# Patient Record
Sex: Female | Born: 1950 | Race: Black or African American | Hispanic: No | Marital: Married | Smoking: Never smoker
Health system: Southern US, Community
[De-identification: ages and names within clinical notes are randomized; demographics above are authoritative.]

## PROBLEM LIST (undated history)

## (undated) DIAGNOSIS — D869 Sarcoidosis, unspecified: Secondary | ICD-10-CM

## (undated) DIAGNOSIS — E119 Type 2 diabetes mellitus without complications: Secondary | ICD-10-CM

## (undated) DIAGNOSIS — G20A1 Parkinson's disease without dyskinesia, without mention of fluctuations: Secondary | ICD-10-CM

## (undated) DIAGNOSIS — J45909 Unspecified asthma, uncomplicated: Secondary | ICD-10-CM

## (undated) DIAGNOSIS — I10 Essential (primary) hypertension: Secondary | ICD-10-CM

## (undated) DIAGNOSIS — R569 Unspecified convulsions: Secondary | ICD-10-CM

## (undated) DIAGNOSIS — G20 Parkinson's disease: Secondary | ICD-10-CM

## (undated) HISTORY — PX: NASAL SINUS SURGERY: SHX719

## (undated) HISTORY — PX: TUBAL LIGATION: SHX77

## (undated) HISTORY — PX: ABDOMINAL HYSTERECTOMY: SHX81

---

## 2009-09-22 ENCOUNTER — Emergency Department (HOSPITAL_BASED_OUTPATIENT_CLINIC_OR_DEPARTMENT_OTHER): Admission: EM | Admit: 2009-09-22 | Discharge: 2009-09-23 | Payer: Self-pay | Admitting: Emergency Medicine

## 2009-09-22 ENCOUNTER — Ambulatory Visit: Payer: Self-pay | Admitting: Diagnostic Radiology

## 2010-03-20 HISTORY — PX: BREAST BIOPSY: SHX20

## 2010-09-04 LAB — URINALYSIS, ROUTINE W REFLEX MICROSCOPIC
Bilirubin Urine: NEGATIVE
Ketones, ur: NEGATIVE mg/dL
Specific Gravity, Urine: 1.021 (ref 1.005–1.030)
Urobilinogen, UA: 0.2 mg/dL (ref 0.0–1.0)

## 2010-09-04 LAB — COMPREHENSIVE METABOLIC PANEL
Albumin: 4.1 g/dL (ref 3.5–5.2)
Calcium: 9.5 mg/dL (ref 8.4–10.5)
Chloride: 101 mEq/L (ref 96–112)
GFR calc Af Amer: 43 mL/min — ABNORMAL LOW (ref >60)
GFR calc non Af Amer: 35 mL/min — ABNORMAL LOW (ref >60)
Glucose, Bld: 110 mg/dL — ABNORMAL HIGH (ref 70–99)
Potassium: 4.7 mEq/L (ref 3.5–5.1)
Sodium: 146 mEq/L — ABNORMAL HIGH (ref 135–145)
Total Protein: 8.3 g/dL (ref 6.0–8.3)

## 2010-09-04 LAB — DIFFERENTIAL
Basophils Absolute: 0 10*3/uL (ref 0.0–0.1)
Basophils Relative: 0 % (ref 0–1)
Eosinophils Absolute: 0.7 10*3/uL (ref 0.0–0.7)
Eosinophils Relative: 10 % — ABNORMAL HIGH (ref 0–5)
Monocytes Absolute: 0.8 10*3/uL (ref 0.1–1.0)
Monocytes Relative: 11 % (ref 3–12)

## 2010-09-04 LAB — CBC
HCT: 41.3 % (ref 36.0–46.0)
Hemoglobin: 13 g/dL (ref 12.0–15.0)
MCHC: 31.5 g/dL (ref 30.0–36.0)
MCV: 89.4 fL (ref 78.0–100.0)
RBC: 4.62 MIL/uL (ref 3.87–5.11)
RDW: 14.7 % (ref 11.5–15.5)
WBC: 7.1 10*3/uL (ref 4.0–10.5)

## 2010-09-04 LAB — URINE MICROSCOPIC-ADD ON

## 2010-09-19 ENCOUNTER — Emergency Department (INDEPENDENT_AMBULATORY_CARE_PROVIDER_SITE_OTHER): Payer: BC Managed Care – PPO

## 2010-09-19 ENCOUNTER — Emergency Department (HOSPITAL_BASED_OUTPATIENT_CLINIC_OR_DEPARTMENT_OTHER)
Admission: EM | Admit: 2010-09-19 | Discharge: 2010-09-19 | Disposition: A | Discharge status: Home / Self Care | Payer: BC Managed Care – PPO | Attending: Emergency Medicine | Admitting: Emergency Medicine

## 2010-09-19 DIAGNOSIS — R0602 Shortness of breath: Secondary | ICD-10-CM | POA: Insufficient documentation

## 2010-09-19 DIAGNOSIS — E119 Type 2 diabetes mellitus without complications: Secondary | ICD-10-CM | POA: Insufficient documentation

## 2010-09-19 DIAGNOSIS — R0609 Other forms of dyspnea: Secondary | ICD-10-CM | POA: Insufficient documentation

## 2010-09-19 DIAGNOSIS — R0989 Other specified symptoms and signs involving the circulatory and respiratory systems: Secondary | ICD-10-CM | POA: Insufficient documentation

## 2010-09-19 DIAGNOSIS — E785 Hyperlipidemia, unspecified: Secondary | ICD-10-CM | POA: Insufficient documentation

## 2010-09-19 DIAGNOSIS — I10 Essential (primary) hypertension: Secondary | ICD-10-CM | POA: Insufficient documentation

## 2010-09-19 DIAGNOSIS — D869 Sarcoidosis, unspecified: Secondary | ICD-10-CM | POA: Insufficient documentation

## 2010-09-19 DIAGNOSIS — Z79899 Other long term (current) drug therapy: Secondary | ICD-10-CM | POA: Insufficient documentation

## 2010-09-19 DIAGNOSIS — R079 Chest pain, unspecified: Secondary | ICD-10-CM

## 2010-09-19 LAB — BASIC METABOLIC PANEL
BUN: 20 mg/dL (ref 6–23)
CO2: 32 mEq/L (ref 19–32)
Chloride: 101 mEq/L (ref 96–112)
Creatinine, Ser: 1 mg/dL (ref 0.4–1.2)
GFR calc Af Amer: >60 mL/min (ref >60)
GFR calc non Af Amer: 56 mL/min — ABNORMAL LOW (ref >60)
Glucose, Bld: 138 mg/dL — ABNORMAL HIGH (ref 70–99)
Potassium: 4.8 mEq/L (ref 3.5–5.1)

## 2010-09-19 LAB — DIFFERENTIAL
Basophils Relative: 0 % (ref 0–1)
Lymphocytes Relative: 16 % (ref 12–46)
Monocytes Absolute: 0.4 10*3/uL (ref 0.1–1.0)

## 2010-09-19 LAB — POCT CARDIAC MARKERS
CKMB, poc: 2.4 ng/mL (ref 1.0–8.0)
Myoglobin, poc: 83.6 ng/mL (ref 12–200)
Troponin i, poc: <0.05 ng/mL (ref 0.00–0.09)

## 2010-09-19 LAB — CBC
HCT: 36.4 % (ref 36.0–46.0)
Hemoglobin: 11.6 g/dL — ABNORMAL LOW (ref 12.0–15.0)
MCV: 89.4 fL (ref 78.0–100.0)
RBC: 4.07 MIL/uL (ref 3.87–5.11)

## 2010-09-28 ENCOUNTER — Emergency Department (INDEPENDENT_AMBULATORY_CARE_PROVIDER_SITE_OTHER): Payer: BC Managed Care – PPO

## 2010-09-28 ENCOUNTER — Emergency Department (HOSPITAL_BASED_OUTPATIENT_CLINIC_OR_DEPARTMENT_OTHER)
Admission: EM | Admit: 2010-09-28 | Discharge: 2010-09-29 | Disposition: A | Discharge status: Home / Self Care | Payer: BC Managed Care – PPO | Attending: Emergency Medicine | Admitting: Emergency Medicine

## 2010-09-28 DIAGNOSIS — J4489 Other specified chronic obstructive pulmonary disease: Secondary | ICD-10-CM | POA: Insufficient documentation

## 2010-09-28 DIAGNOSIS — I10 Essential (primary) hypertension: Secondary | ICD-10-CM | POA: Insufficient documentation

## 2010-09-28 DIAGNOSIS — R0602 Shortness of breath: Secondary | ICD-10-CM

## 2010-09-28 DIAGNOSIS — E119 Type 2 diabetes mellitus without complications: Secondary | ICD-10-CM | POA: Insufficient documentation

## 2010-09-28 DIAGNOSIS — J189 Pneumonia, unspecified organism: Secondary | ICD-10-CM

## 2010-09-28 DIAGNOSIS — E785 Hyperlipidemia, unspecified: Secondary | ICD-10-CM | POA: Insufficient documentation

## 2010-09-28 DIAGNOSIS — J449 Chronic obstructive pulmonary disease, unspecified: Secondary | ICD-10-CM | POA: Insufficient documentation

## 2010-09-28 DIAGNOSIS — Z79899 Other long term (current) drug therapy: Secondary | ICD-10-CM | POA: Insufficient documentation

## 2010-09-29 LAB — BASIC METABOLIC PANEL
Calcium: 9 mg/dL (ref 8.4–10.5)
Chloride: 95 mEq/L — ABNORMAL LOW (ref 96–112)
Glucose, Bld: 201 mg/dL — ABNORMAL HIGH (ref 70–99)
Sodium: 138 mEq/L (ref 135–145)

## 2010-09-29 LAB — CBC
HCT: 37.8 % (ref 36.0–46.0)
MCH: 28 pg (ref 26.0–34.0)
Platelets: 142 10*3/uL — ABNORMAL LOW (ref 150–400)
WBC: 15.8 10*3/uL — ABNORMAL HIGH (ref 4.0–10.5)

## 2010-09-29 LAB — DIFFERENTIAL
Eosinophils Relative: 1 % (ref 0–5)
Lymphs Abs: 0.6 10*3/uL — ABNORMAL LOW (ref 0.7–4.0)
Monocytes Absolute: 1.2 10*3/uL — ABNORMAL HIGH (ref 0.1–1.0)
Monocytes Relative: 7 % (ref 3–12)
Neutro Abs: 13.9 10*3/uL — ABNORMAL HIGH (ref 1.7–7.7)
Neutrophils Relative %: 88 % — ABNORMAL HIGH (ref 43–77)

## 2010-10-05 LAB — CULTURE, BLOOD (ROUTINE X 2)
Culture  Setup Time: 201204150508
Culture: NO GROWTH
Culture: NO GROWTH

## 2012-09-08 DIAGNOSIS — G250 Essential tremor: Secondary | ICD-10-CM | POA: Insufficient documentation

## 2012-12-02 ENCOUNTER — Emergency Department (HOSPITAL_BASED_OUTPATIENT_CLINIC_OR_DEPARTMENT_OTHER)
Admission: EM | Admit: 2012-12-02 | Discharge: 2012-12-02 | Disposition: A | Discharge status: Home / Self Care | Attending: Emergency Medicine | Admitting: Emergency Medicine

## 2012-12-02 ENCOUNTER — Encounter (HOSPITAL_BASED_OUTPATIENT_CLINIC_OR_DEPARTMENT_OTHER): Payer: Self-pay | Admitting: *Deleted

## 2012-12-02 ENCOUNTER — Emergency Department (HOSPITAL_BASED_OUTPATIENT_CLINIC_OR_DEPARTMENT_OTHER)

## 2012-12-02 DIAGNOSIS — Z8619 Personal history of other infectious and parasitic diseases: Secondary | ICD-10-CM | POA: Insufficient documentation

## 2012-12-02 DIAGNOSIS — Z79899 Other long term (current) drug therapy: Secondary | ICD-10-CM | POA: Insufficient documentation

## 2012-12-02 DIAGNOSIS — Z8669 Personal history of other diseases of the nervous system and sense organs: Secondary | ICD-10-CM | POA: Insufficient documentation

## 2012-12-02 DIAGNOSIS — R220 Localized swelling, mass and lump, head: Secondary | ICD-10-CM | POA: Insufficient documentation

## 2012-12-02 DIAGNOSIS — E119 Type 2 diabetes mellitus without complications: Secondary | ICD-10-CM | POA: Insufficient documentation

## 2012-12-02 DIAGNOSIS — I10 Essential (primary) hypertension: Secondary | ICD-10-CM | POA: Insufficient documentation

## 2012-12-02 DIAGNOSIS — R221 Localized swelling, mass and lump, neck: Secondary | ICD-10-CM | POA: Insufficient documentation

## 2012-12-02 DIAGNOSIS — M549 Dorsalgia, unspecified: Secondary | ICD-10-CM

## 2012-12-02 HISTORY — DX: Sarcoidosis, unspecified: D86.9

## 2012-12-02 HISTORY — DX: Essential (primary) hypertension: I10

## 2012-12-02 HISTORY — DX: Type 2 diabetes mellitus without complications: E11.9

## 2012-12-02 HISTORY — DX: Unspecified convulsions: R56.9

## 2012-12-02 LAB — URINALYSIS, ROUTINE W REFLEX MICROSCOPIC
Ketones, ur: NEGATIVE mg/dL
Protein, ur: 30 mg/dL — AB
Specific Gravity, Urine: 1.025 (ref 1.005–1.030)
Urobilinogen, UA: 1 mg/dL (ref 0.0–1.0)

## 2012-12-02 LAB — URINE MICROSCOPIC-ADD ON

## 2012-12-02 LAB — CBC WITH DIFFERENTIAL/PLATELET
Basophils Absolute: 0 10*3/uL (ref 0.0–0.1)
Basophils Relative: 0 % (ref 0–1)
Eosinophils Absolute: 0.5 10*3/uL (ref 0.0–0.7)
Eosinophils Relative: 13 % — ABNORMAL HIGH (ref 0–5)
Lymphs Abs: 0.7 10*3/uL (ref 0.7–4.0)
MCH: 28.5 pg (ref 26.0–34.0)
MCV: 88.7 fL (ref 78.0–100.0)
Neutrophils Relative %: 56 % (ref 43–77)
Platelets: 135 10*3/uL — ABNORMAL LOW (ref 150–400)
RBC: 4.59 MIL/uL (ref 3.87–5.11)
RDW: 13.2 % (ref 11.5–15.5)

## 2012-12-02 LAB — COMPREHENSIVE METABOLIC PANEL
ALT: 14 U/L (ref 0–35)
AST: 26 U/L (ref 0–37)
Albumin: 4.2 g/dL (ref 3.5–5.2)
Alkaline Phosphatase: 98 U/L (ref 39–117)
Calcium: 9.8 mg/dL (ref 8.4–10.5)
GFR calc Af Amer: 61 mL/min — ABNORMAL LOW (ref >90)
Glucose, Bld: 137 mg/dL — ABNORMAL HIGH (ref 70–99)
Potassium: 4.5 mEq/L (ref 3.5–5.1)
Sodium: 140 mEq/L (ref 135–145)
Total Protein: 8 g/dL (ref 6.0–8.3)

## 2012-12-02 MED ORDER — HYDROCODONE-ACETAMINOPHEN 5-325 MG PO TABS: 1.0000 | ORAL_TABLET | ORAL | Status: DC | PRN

## 2012-12-02 MED ORDER — MORPHINE SULFATE 4 MG/ML IJ SOLN
4.0000 mg | Freq: Once | INTRAMUSCULAR | Status: AC
  Administered 2012-12-02: 4 mg via INTRAVENOUS
  Filled 2012-12-02: qty 1

## 2012-12-02 MED ORDER — ONDANSETRON HCL 4 MG/2ML IJ SOLN
4.0000 mg | Freq: Once | INTRAMUSCULAR | Status: AC
  Administered 2012-12-02: 4 mg via INTRAVENOUS
  Filled 2012-12-02: qty 2

## 2012-12-02 NOTE — ED Notes (Signed)
Left flank pain since this am

## 2012-12-02 NOTE — ED Provider Notes (Signed)
Medical screening examination/treatment/procedure(s) were performed by non-physician practitioner and as supervising physician I was immediately available for consultation/collaboration.   Gwyneth Sprout, MD 12/02/12 [phone]

## 2012-12-02 NOTE — ED Notes (Signed)
PA at bedside.

## 2012-12-02 NOTE — ED Notes (Signed)
Pt found in triage to have an O2 sat of 87%. O2 sat assessed again in exam room and found to be 85%. Pt placed on O2 at 2LPM via [place] and sats increased to 100%. Pt sts she wears O2 at 2LPM at home.

## 2012-12-02 NOTE — ED Provider Notes (Signed)
History     CSN: [id number]  Arrival date & time 12/02/12  4098   First MD Initiated Contact with Patient 12/02/12 1908      Chief Complaint  Patient presents with  . Flank Pain    (Consider location/radiation/quality/duration/timing/severity/associated sxs/prior treatment) HPI Comments: Pt c/o left lower back pain with movement:denies any recent injury or falls:worse with movement  Patient is a 62 y.o. female presenting with flank pain. The history is provided by the patient. No language interpreter was used.  Flank Pain This is a new problem. The current episode started today. The problem occurs constantly. The problem has been unchanged. Pertinent negatives include no abdominal pain, congestion, coughing, fever or urinary symptoms.    Past Medical History  Diagnosis Date  . Diabetes mellitus without complication   . Seizures   . Hypertension   . Sarcoidosis     History reviewed. No pertinent past surgical history.  No family history on file.  History  Substance Use Topics  . Smoking status: Never Smoker   . Smokeless tobacco: Not on file  . Alcohol Use: No    OB History   Grav Para Term Preterm Abortions TAB SAB Ect Mult Living                  Review of Systems  Constitutional: Negative for fever.  HENT: Negative for congestion.   Respiratory: Negative for cough.   Gastrointestinal: Negative for abdominal pain.  Genitourinary: Positive for flank pain.    Allergies  Review of patient's allergies indicates no known allergies.  Home Medications   Current Outpatient Rx  Name  Route  Sig  Dispense  Refill  . LamoTRIgine (LAMICTAL PO)   Oral   Take by mouth.         . METFORMIN HCL PO   Oral   Take by mouth.         . SitaGLIPtin Phosphate (JANUVIA PO)   Oral   Take by mouth.           BP 144/80  Pulse 88  Temp(Src) 98.4 F (36.9 C) (Oral)  Resp 20  Wt 186 lb (84.369 kg)  SpO2 85%  Physical Exam  Nursing note and vitals  reviewed. Constitutional: She is oriented to person, place, and time. She appears well-developed and well-nourished.  HENT:  Head: Normocephalic and atraumatic.  Cardiovascular: Normal rate and regular rhythm.   Pulmonary/Chest: Effort normal and breath sounds normal.  Abdominal: Soft. Bowel sounds are normal. There is no tenderness.  Musculoskeletal: Normal range of motion.  Pt tender in left lower lumbar area  Neurological: She is alert and oriented to person, place, and time.  Pt tremulous in upper extremities  Skin: Skin is warm and dry.  Psychiatric: She has a normal mood and affect.    ED Course  Procedures (including critical care time)  Labs Reviewed  URINALYSIS, ROUTINE W REFLEX MICROSCOPIC - Abnormal; Notable for the following:    Protein, ur 30 (*)    Leukocytes, UA SMALL (*)    All other components within normal limits  CBC WITH DIFFERENTIAL - Abnormal; Notable for the following:    WBC 3.9 (*)    Platelets 135 (*)    Monocytes Relative 14 (*)    Eosinophils Relative 13 (*)    All other components within normal limits  URINE CULTURE  URINE MICROSCOPIC-ADD ON  COMPREHENSIVE METABOLIC PANEL   Dg Lumbar Spine Complete  12/02/2012   *RADIOLOGY REPORT*  Clinical  Data: Low back and right lower extremity pain.  LUMBAR SPINE - COMPLETE 4+ VIEW  Comparison: None.  Findings: Mild narrowing of interspaces L2-3, L3-4, L4-5, L5-S1. There is grade 1 anterolisthesis L4-5, probably related to bilateral facet disease at this level. No pars defect evident. Negative for fracture.  Small anterior endplate spurs L2- S1.  IMPRESSION: 1.  Negative for fracture or other acute bony abnormality. 2.  Multilevel degenerative changes as above.   Original Report Authenticated By: D. Andria Rhein, MD     1. Back pain       MDM  Pt is feeling better at this time:pt is okay to go home on something for pain;pt state that her is oxygen at home and that is why her level was low when she came  in        [place], [place] 12/02/12 2156

## 2012-12-04 LAB — URINE CULTURE: Colony Count: 65000

## 2013-06-08 ENCOUNTER — Emergency Department (HOSPITAL_BASED_OUTPATIENT_CLINIC_OR_DEPARTMENT_OTHER)
Admission: EM | Admit: 2013-06-08 | Discharge: 2013-06-08 | Disposition: A | Discharge status: Home / Self Care | Attending: Emergency Medicine | Admitting: Emergency Medicine

## 2013-06-08 ENCOUNTER — Encounter (HOSPITAL_BASED_OUTPATIENT_CLINIC_OR_DEPARTMENT_OTHER): Payer: Self-pay | Admitting: Emergency Medicine

## 2013-06-08 DIAGNOSIS — E119 Type 2 diabetes mellitus without complications: Secondary | ICD-10-CM | POA: Insufficient documentation

## 2013-06-08 DIAGNOSIS — B0230 Zoster ocular disease, unspecified: Secondary | ICD-10-CM | POA: Insufficient documentation

## 2013-06-08 DIAGNOSIS — G40909 Epilepsy, unspecified, not intractable, without status epilepticus: Secondary | ICD-10-CM | POA: Insufficient documentation

## 2013-06-08 DIAGNOSIS — B029 Zoster without complications: Secondary | ICD-10-CM

## 2013-06-08 DIAGNOSIS — I10 Essential (primary) hypertension: Secondary | ICD-10-CM | POA: Insufficient documentation

## 2013-06-08 MED ORDER — ACYCLOVIR 800 MG PO TABS: ORAL_TABLET | ORAL | Status: DC

## 2013-06-08 MED ORDER — HYDROCODONE-ACETAMINOPHEN 5-325 MG PO TABS: 2.0000 | ORAL_TABLET | ORAL | Status: DC | PRN

## 2013-06-08 NOTE — ED Notes (Signed)
Patient here with increasing eye drainage to left eye with blisters to lid and top of nose. Using drops for excessive tear production

## 2013-06-08 NOTE — ED Provider Notes (Signed)
CSN: [id number]     Arrival date & time 06/08/13  1528 History   First MD Initiated Contact with Patient 06/08/13 1608     Chief Complaint  Patient presents with  . Eye Problem   (Consider location/radiation/quality/duration/timing/severity/associated sxs/prior Treatment) Patient is a 62 y.o. female presenting with eye problem. The history is provided by the patient. No language interpreter was used.  Eye Problem Location:  L eye Quality:  Aching Severity:  Moderate Onset quality:  Gradual Timing:  Constant Progression:  Worsening Chronicity:  New Relieved by:  Nothing Worsened by:  Nothing tried Associated symptoms: no blurred vision and no crusting   Pt was treated by her MD for an eye infection with tobrex.  Pt complains of blisters on her nose and forehead.  Past Medical History  Diagnosis Date  . Diabetes mellitus without complication   . Seizures   . Hypertension   . Sarcoidosis    History reviewed. No pertinent past surgical history. No family history on file. History  Substance Use Topics  . Smoking status: Never Smoker   . Smokeless tobacco: Not on file  . Alcohol Use: No   OB History   Grav Para Term Preterm Abortions TAB SAB Ect Mult Living                 Review of Systems  Eyes: Negative for blurred vision.  Skin: Positive for rash.  All other systems reviewed and are negative.    Allergies  Review of patient's allergies indicates no known allergies.  Home Medications   Current Outpatient Rx  Name  Route  Sig  Dispense  Refill  . tobramycin (TOBREX) 0.3 % ophthalmic solution      every 4 (four) hours.         Marland Kitchen acyclovir (ZOVIRAX) 800 MG tablet      One po qid   50 tablet   0   . HYDROcodone-acetaminophen (NORCO/VICODIN) 5-325 MG per tablet   Oral   Take 1 tablet by mouth every 4 (four) hours as needed for pain.   10 tablet   0   . HYDROcodone-acetaminophen (NORCO/VICODIN) 5-325 MG per tablet   Oral   Take 2 tablets by mouth  every 4 (four) hours as needed.   20 tablet   0   . LamoTRIgine (LAMICTAL PO)   Oral   Take by mouth.         . METFORMIN HCL PO   Oral   Take by mouth.         . SitaGLIPtin Phosphate (JANUVIA PO)   Oral   Take by mouth.          BP 166/87  Pulse 87  Temp(Src) 98.9 F (37.2 C)  Resp 18 Physical Exam  Nursing note and vitals reviewed. Constitutional: She appears well-developed and well-nourished.  HENT:  Head: Normocephalic and atraumatic.  Right Ear: External ear normal.  Left Ear: External ear normal.  Mouth/Throat: Oropharynx is clear and moist.  Eyes: EOM are normal. Pupils are equal, round, and reactive to light.  Slight erythema  Neck: Normal range of motion. Neck supple.  Cardiovascular: Normal rate, regular rhythm and normal heart sounds.   Pulmonary/Chest: Effort normal and breath sounds normal.  Abdominal: Soft.  Musculoskeletal: Normal range of motion.  Neurological: She is alert.  Skin:  Multiple pustules nose and forehead,   Psychiatric: She has a normal mood and affect.    ED Course  Procedures (including critical care time) Labs  Review Labs Reviewed - No data to display Imaging Review No results found.  EKG Interpretation   None       MDM   1. Shingles    Pt counseled on shingles,   Pt's eye is improving.   Pt given rx for acyclovir and hydrocodone    Elson Areas, PA-C 06/08/13 2008

## 2013-06-09 NOTE — ED Provider Notes (Signed)
Medical screening examination/treatment/procedure(s) were performed by non-physician practitioner and as supervising physician I was immediately available for consultation/collaboration.  EKG Interpretation   None        Elvira Langston F Sarinity Dicicco, MD 06/09/13 1447 

## 2013-12-05 DIAGNOSIS — R928 Other abnormal and inconclusive findings on diagnostic imaging of breast: Secondary | ICD-10-CM | POA: Insufficient documentation

## 2013-12-05 DIAGNOSIS — N63 Unspecified lump in unspecified breast: Secondary | ICD-10-CM | POA: Insufficient documentation

## 2013-12-05 DIAGNOSIS — M51369 Other intervertebral disc degeneration, lumbar region without mention of lumbar back pain or lower extremity pain: Secondary | ICD-10-CM | POA: Insufficient documentation

## 2013-12-05 DIAGNOSIS — B009 Herpesviral infection, unspecified: Secondary | ICD-10-CM | POA: Insufficient documentation

## 2013-12-05 DIAGNOSIS — L600 Ingrowing nail: Secondary | ICD-10-CM | POA: Insufficient documentation

## 2013-12-05 DIAGNOSIS — R251 Tremor, unspecified: Secondary | ICD-10-CM | POA: Insufficient documentation

## 2013-12-05 DIAGNOSIS — R0602 Shortness of breath: Secondary | ICD-10-CM | POA: Insufficient documentation

## 2013-12-05 DIAGNOSIS — M8580 Other specified disorders of bone density and structure, unspecified site: Secondary | ICD-10-CM | POA: Insufficient documentation

## 2013-12-05 DIAGNOSIS — D869 Sarcoidosis, unspecified: Secondary | ICD-10-CM | POA: Insufficient documentation

## 2013-12-05 DIAGNOSIS — M7072 Other bursitis of hip, left hip: Secondary | ICD-10-CM | POA: Insufficient documentation

## 2013-12-05 DIAGNOSIS — H1030 Unspecified acute conjunctivitis, unspecified eye: Secondary | ICD-10-CM | POA: Insufficient documentation

## 2013-12-05 DIAGNOSIS — E785 Hyperlipidemia, unspecified: Secondary | ICD-10-CM | POA: Insufficient documentation

## 2013-12-05 DIAGNOSIS — M2040 Other hammer toe(s) (acquired), unspecified foot: Secondary | ICD-10-CM | POA: Insufficient documentation

## 2013-12-05 DIAGNOSIS — I10 Essential (primary) hypertension: Secondary | ICD-10-CM | POA: Insufficient documentation

## 2013-12-05 DIAGNOSIS — R809 Proteinuria, unspecified: Secondary | ICD-10-CM | POA: Insufficient documentation

## 2013-12-05 DIAGNOSIS — M7550 Bursitis of unspecified shoulder: Secondary | ICD-10-CM | POA: Insufficient documentation

## 2013-12-05 DIAGNOSIS — M25559 Pain in unspecified hip: Secondary | ICD-10-CM | POA: Insufficient documentation

## 2013-12-05 DIAGNOSIS — G479 Sleep disorder, unspecified: Secondary | ICD-10-CM | POA: Insufficient documentation

## 2013-12-05 DIAGNOSIS — M25662 Stiffness of left knee, not elsewhere classified: Secondary | ICD-10-CM | POA: Insufficient documentation

## 2013-12-05 DIAGNOSIS — R109 Unspecified abdominal pain: Secondary | ICD-10-CM | POA: Insufficient documentation

## 2014-04-05 DIAGNOSIS — G8929 Other chronic pain: Secondary | ICD-10-CM | POA: Insufficient documentation

## 2014-08-10 DIAGNOSIS — Z79899 Other long term (current) drug therapy: Secondary | ICD-10-CM | POA: Insufficient documentation

## 2014-11-27 DIAGNOSIS — M170 Bilateral primary osteoarthritis of knee: Secondary | ICD-10-CM | POA: Insufficient documentation

## 2015-01-01 DIAGNOSIS — R6882 Decreased libido: Secondary | ICD-10-CM | POA: Insufficient documentation

## 2015-01-01 DIAGNOSIS — Z23 Encounter for immunization: Secondary | ICD-10-CM | POA: Insufficient documentation

## 2015-01-04 DIAGNOSIS — Z1231 Encounter for screening mammogram for malignant neoplasm of breast: Secondary | ICD-10-CM | POA: Insufficient documentation

## 2016-01-31 DIAGNOSIS — R0902 Hypoxemia: Secondary | ICD-10-CM | POA: Insufficient documentation

## 2016-01-31 DIAGNOSIS — D862 Sarcoidosis of lung with sarcoidosis of lymph nodes: Secondary | ICD-10-CM | POA: Insufficient documentation

## 2016-01-31 DIAGNOSIS — J984 Other disorders of lung: Secondary | ICD-10-CM | POA: Insufficient documentation

## 2016-02-07 DIAGNOSIS — G259 Extrapyramidal and movement disorder, unspecified: Secondary | ICD-10-CM | POA: Insufficient documentation

## 2016-02-07 DIAGNOSIS — Z23 Encounter for immunization: Secondary | ICD-10-CM | POA: Insufficient documentation

## 2016-04-21 DIAGNOSIS — H16222 Keratoconjunctivitis sicca, not specified as Sjogren's, left eye: Secondary | ICD-10-CM | POA: Insufficient documentation

## 2016-08-12 DIAGNOSIS — M25532 Pain in left wrist: Secondary | ICD-10-CM | POA: Insufficient documentation

## 2016-08-12 DIAGNOSIS — R200 Anesthesia of skin: Secondary | ICD-10-CM | POA: Insufficient documentation

## 2018-08-22 ENCOUNTER — Observation Stay (HOSPITAL_COMMUNITY): Payer: Medicare Other

## 2018-08-22 ENCOUNTER — Other Ambulatory Visit: Payer: Self-pay

## 2018-08-22 ENCOUNTER — Emergency Department (HOSPITAL_BASED_OUTPATIENT_CLINIC_OR_DEPARTMENT_OTHER): Payer: Medicare Other

## 2018-08-22 ENCOUNTER — Inpatient Hospital Stay (HOSPITAL_BASED_OUTPATIENT_CLINIC_OR_DEPARTMENT_OTHER)
Admission: EM | Admit: 2018-08-22 | Discharge: 2018-08-23 | DRG: 069 | Disposition: A | Discharge status: Home Health | Payer: Medicare Other | Attending: Internal Medicine | Admitting: Internal Medicine

## 2018-08-22 ENCOUNTER — Encounter (HOSPITAL_BASED_OUTPATIENT_CLINIC_OR_DEPARTMENT_OTHER): Payer: Self-pay

## 2018-08-22 DIAGNOSIS — I6621 Occlusion and stenosis of right posterior cerebral artery: Secondary | ICD-10-CM | POA: Diagnosis present

## 2018-08-22 DIAGNOSIS — I10 Essential (primary) hypertension: Secondary | ICD-10-CM | POA: Diagnosis present

## 2018-08-22 DIAGNOSIS — D869 Sarcoidosis, unspecified: Secondary | ICD-10-CM | POA: Diagnosis present

## 2018-08-22 DIAGNOSIS — E669 Obesity, unspecified: Secondary | ICD-10-CM | POA: Diagnosis not present

## 2018-08-22 DIAGNOSIS — J9611 Chronic respiratory failure with hypoxia: Secondary | ICD-10-CM | POA: Diagnosis present

## 2018-08-22 DIAGNOSIS — N183 Chronic kidney disease, stage 3 unspecified: Secondary | ICD-10-CM | POA: Diagnosis present

## 2018-08-22 DIAGNOSIS — D61818 Other pancytopenia: Secondary | ICD-10-CM | POA: Diagnosis present

## 2018-08-22 DIAGNOSIS — R569 Unspecified convulsions: Secondary | ICD-10-CM | POA: Diagnosis not present

## 2018-08-22 DIAGNOSIS — R531 Weakness: Secondary | ICD-10-CM | POA: Diagnosis not present

## 2018-08-22 DIAGNOSIS — M5481 Occipital neuralgia: Secondary | ICD-10-CM | POA: Diagnosis present

## 2018-08-22 DIAGNOSIS — Z6832 Body mass index (BMI) 32.0-32.9, adult: Secondary | ICD-10-CM | POA: Diagnosis not present

## 2018-08-22 DIAGNOSIS — G459 Transient cerebral ischemic attack, unspecified: Secondary | ICD-10-CM | POA: Diagnosis not present

## 2018-08-22 DIAGNOSIS — J439 Emphysema, unspecified: Secondary | ICD-10-CM | POA: Diagnosis not present

## 2018-08-22 DIAGNOSIS — E1122 Type 2 diabetes mellitus with diabetic chronic kidney disease: Secondary | ICD-10-CM | POA: Diagnosis not present

## 2018-08-22 DIAGNOSIS — G20 Parkinson's disease: Secondary | ICD-10-CM | POA: Diagnosis not present

## 2018-08-22 DIAGNOSIS — G43109 Migraine with aura, not intractable, without status migrainosus: Secondary | ICD-10-CM | POA: Diagnosis present

## 2018-08-22 DIAGNOSIS — I639 Cerebral infarction, unspecified: Secondary | ICD-10-CM | POA: Diagnosis present

## 2018-08-22 DIAGNOSIS — Z7984 Long term (current) use of oral hypoglycemic drugs: Secondary | ICD-10-CM

## 2018-08-22 DIAGNOSIS — Z8673 Personal history of transient ischemic attack (TIA), and cerebral infarction without residual deficits: Secondary | ICD-10-CM | POA: Diagnosis not present

## 2018-08-22 DIAGNOSIS — R200 Anesthesia of skin: Secondary | ICD-10-CM | POA: Diagnosis not present

## 2018-08-22 DIAGNOSIS — G40909 Epilepsy, unspecified, not intractable, without status epilepticus: Secondary | ICD-10-CM | POA: Diagnosis present

## 2018-08-22 DIAGNOSIS — R2990 Unspecified symptoms and signs involving the nervous system: Secondary | ICD-10-CM

## 2018-08-22 DIAGNOSIS — I129 Hypertensive chronic kidney disease with stage 1 through stage 4 chronic kidney disease, or unspecified chronic kidney disease: Secondary | ICD-10-CM | POA: Diagnosis present

## 2018-08-22 DIAGNOSIS — E785 Hyperlipidemia, unspecified: Secondary | ICD-10-CM | POA: Diagnosis present

## 2018-08-22 DIAGNOSIS — R29702 NIHSS score 2: Secondary | ICD-10-CM | POA: Diagnosis not present

## 2018-08-22 DIAGNOSIS — Z9981 Dependence on supplemental oxygen: Secondary | ICD-10-CM

## 2018-08-22 LAB — COMPREHENSIVE METABOLIC PANEL
ALT: <5 U/L (ref 0–44)
AST: 18 U/L (ref 15–41)
Albumin: 4.2 g/dL (ref 3.5–5.0)
Alkaline Phosphatase: 90 U/L (ref 38–126)
Anion gap: 9 (ref 5–15)
BUN: 22 mg/dL (ref 8–23)
CO2: 31 mmol/L (ref 22–32)
Calcium: 9.2 mg/dL (ref 8.9–10.3)
Chloride: 97 mmol/L — ABNORMAL LOW (ref 98–111)
Creatinine, Ser: 1.41 mg/dL — ABNORMAL HIGH (ref 0.44–1.00)
GFR calc Af Amer: 44 mL/min — ABNORMAL LOW (ref >60)
GFR calc non Af Amer: 38 mL/min — ABNORMAL LOW (ref >60)
Glucose, Bld: 127 mg/dL — ABNORMAL HIGH (ref 70–99)
POTASSIUM: 4.4 mmol/L (ref 3.5–5.1)
Sodium: 137 mmol/L (ref 135–145)
TOTAL PROTEIN: 7.5 g/dL (ref 6.5–8.1)
Total Bilirubin: 0.7 mg/dL (ref 0.3–1.2)

## 2018-08-22 LAB — DIFFERENTIAL
Abs Immature Granulocytes: 0.01 10*3/uL (ref 0.00–0.07)
Basophils Absolute: 0 10*3/uL (ref 0.0–0.1)
Basophils Relative: 1 %
EOS ABS: 0.2 10*3/uL (ref 0.0–0.5)
Eosinophils Relative: 6 %
IMMATURE GRANULOCYTES: 0 %
Lymphocytes Relative: 11 %
Lymphs Abs: 0.4 10*3/uL — ABNORMAL LOW (ref 0.7–4.0)
Monocytes Absolute: 0.4 10*3/uL (ref 0.1–1.0)
Monocytes Relative: 13 %
Neutro Abs: 2.4 10*3/uL (ref 1.7–7.7)
Neutrophils Relative %: 69 %

## 2018-08-22 LAB — CBC
HCT: 42.8 % (ref 36.0–46.0)
Hemoglobin: 13.3 g/dL (ref 12.0–15.0)
MCH: 28.9 pg (ref 26.0–34.0)
MCHC: 31.1 g/dL (ref 30.0–36.0)
MCV: 92.8 fL (ref 80.0–100.0)
Platelets: 143 10*3/uL — ABNORMAL LOW (ref 150–400)
RBC: 4.61 MIL/uL (ref 3.87–5.11)
RDW: 13.2 % (ref 11.5–15.5)
WBC: 3.4 10*3/uL — ABNORMAL LOW (ref 4.0–10.5)
nRBC: 0 % (ref 0.0–0.2)

## 2018-08-22 LAB — APTT: aPTT: 30 seconds (ref 24–36)

## 2018-08-22 LAB — PROTIME-INR
INR: 1 (ref 0.8–1.2)
Prothrombin Time: 13.2 seconds (ref 11.4–15.2)

## 2018-08-22 LAB — GLUCOSE, CAPILLARY
Glucose-Capillary: 67 mg/dL — ABNORMAL LOW (ref 70–99)
Glucose-Capillary: 184 mg/dL — ABNORMAL HIGH (ref 70–99)

## 2018-08-22 LAB — CBG MONITORING, ED: Glucose-Capillary: 103 mg/dL — ABNORMAL HIGH (ref 70–99)

## 2018-08-22 LAB — ETHANOL: Alcohol, Ethyl (B): <10 mg/dL (ref <10)

## 2018-08-22 MED ORDER — ASPIRIN 325 MG PO TABS
325.0000 mg | ORAL_TABLET | Freq: Every day | ORAL | Status: DC
  Administered 2018-08-23: 325 mg via ORAL
  Filled 2018-08-22: qty 1

## 2018-08-22 MED ORDER — ACETAMINOPHEN 325 MG PO TABS: 650.0000 mg | ORAL_TABLET | ORAL | Status: DC | PRN

## 2018-08-22 MED ORDER — CARBIDOPA-LEVODOPA ER 50-200 MG PO TBCR
1.0000 | EXTENDED_RELEASE_TABLET | Freq: Three times a day (TID) | ORAL | Status: DC
  Administered 2018-08-23 (×3): 1 via ORAL
  Filled 2018-08-22 (×4): qty 1

## 2018-08-22 MED ORDER — INSULIN ASPART 100 UNIT/ML ~~LOC~~ SOLN: 0.0000 [IU] | Freq: Every day | SUBCUTANEOUS | Status: DC

## 2018-08-22 MED ORDER — ASPIRIN 300 MG RE SUPP: 300.0000 mg | Freq: Every day | RECTAL | Status: DC

## 2018-08-22 MED ORDER — ACETAMINOPHEN 160 MG/5ML PO SOLN: 650.0000 mg | ORAL | Status: DC | PRN

## 2018-08-22 MED ORDER — LAMOTRIGINE 100 MG PO TABS
200.0000 mg | ORAL_TABLET | Freq: Two times a day (BID) | ORAL | Status: DC
  Administered 2018-08-23 (×2): 200 mg via ORAL
  Filled 2018-08-22 (×2): qty 2

## 2018-08-22 MED ORDER — ASPIRIN 81 MG PO CHEW
324.0000 mg | CHEWABLE_TABLET | Freq: Once | ORAL | Status: AC
  Administered 2018-08-22: 324 mg via ORAL
  Filled 2018-08-22: qty 4

## 2018-08-22 MED ORDER — HEPARIN SODIUM (PORCINE) 5000 UNIT/ML IJ SOLN
5000.0000 [IU] | Freq: Three times a day (TID) | INTRAMUSCULAR | Status: DC
  Administered 2018-08-22 – 2018-08-23 (×3): 5000 [IU] via SUBCUTANEOUS
  Filled 2018-08-22 (×4): qty 1

## 2018-08-22 MED ORDER — STROKE: EARLY STAGES OF RECOVERY BOOK
Freq: Once | Status: AC
  Administered 2018-08-22: 22:00:00

## 2018-08-22 MED ORDER — GABAPENTIN 400 MG PO CAPS: 800.0000 mg | ORAL_CAPSULE | Freq: Three times a day (TID) | ORAL | Status: DC

## 2018-08-22 MED ORDER — SENNOSIDES-DOCUSATE SODIUM 8.6-50 MG PO TABS: 1.0000 | ORAL_TABLET | Freq: Every evening | ORAL | Status: DC | PRN

## 2018-08-22 MED ORDER — SODIUM CHLORIDE 0.9 % IV SOLN: INTRAVENOUS | Status: AC

## 2018-08-22 MED ORDER — SODIUM CHLORIDE 0.9 % IV SOLN
Freq: Once | INTRAVENOUS | Status: AC
  Administered 2018-08-22: 125 mL/h via INTRAVENOUS

## 2018-08-22 MED ORDER — INSULIN ASPART 100 UNIT/ML ~~LOC~~ SOLN
0.0000 [IU] | Freq: Three times a day (TID) | SUBCUTANEOUS | Status: DC
  Administered 2018-08-23 (×2): 1 [IU] via SUBCUTANEOUS

## 2018-08-22 MED ORDER — ACETAMINOPHEN 650 MG RE SUPP: 650.0000 mg | RECTAL | Status: DC | PRN

## 2018-08-22 NOTE — H&P (Signed)
History and Physical    Sara Raymond WUJ:[id number] DOB: 03/11/1951 DOA: 08/22/2018  PCP: Angelica ChessmanAguiar, Rafaela M, MD   Patient coming from: Home   Chief Complaint: Headache, dizziness, right-sided weakness   HPI: Sara Raymond is a 68 y.o. female with medical history significant for type 2 diabetes mellitus, seizure disorder, and hypertension, now presenting to the emergency department for evaluation of headache, dizziness, and right-sided weakness.  Patient was in her usual state of health when she went to church today, but while there she developed an occipital headache, mainly on the left side with radiation towards the left parietal area, and associated with dizziness and right arm weakness.  Family had reported that she seemed to be confused at the time.  She denies any recent fall or trauma, denies recent fevers or chills, denies chest pain or palpitations, and denies frequent headaches.  Symptoms resolve spontaneously prior to arrival at [facility] where she reports feeling as though she is back to her usual state.  [facility] ED Course: Upon arrival to the ED, patient is found to be afebrile, saturating well on room air, and with vitals otherwise normal.  EKG features sinus rhythm with nonspecific ST abnormality in the inferior leads.  Noncontrast head CT is negative for acute intracranial abnormality, but notable for remote appearing right basal ganglia infarct.  Chemistry panel features a creatinine 1.41, up from 1.23 and 2018.  CBC is notable for a mild chronic leukopenia and thrombocytopenia.  Patient was evaluated by telemetry neurologist in the emergency department, there is concern for TIA, and admission for CVA/TIA work-up was recommended.  Patient was given 324 mg of aspirin in the ED and [facility] for ongoing evaluation and management.  Review of Systems:  All other systems reviewed and apart from HPI, are negative.  Past Medical History:  Diagnosis  Date  . Diabetes mellitus without complication (HCC)   . Hypertension   . Sarcoidosis   . Seizures (HCC)     History reviewed. No pertinent surgical history.   reports that she has never smoked. She has never used smokeless tobacco. She reports that she does not drink alcohol or use drugs.  No Known Allergies  Family History  Problem Relation Age of Onset  . Hypertension Other      Prior to Admission medications   Medication Sig Start Date End Date Taking? Authorizing Provider  acyclovir (ZOVIRAX) 800 MG tablet One po qid 06/08/13   Elson AreasSofia, Leslie K, PA-C  HYDROcodone-acetaminophen (NORCO/VICODIN) 5-325 MG per tablet Take 1 tablet by mouth every 4 (four) hours as needed for pain. 12/02/12   Teressa LowerPickering, Vrinda, NP  HYDROcodone-acetaminophen (NORCO/VICODIN) 5-325 MG per tablet Take 2 tablets by mouth every 4 (four) hours as needed. 06/08/13   Elson AreasSofia, Leslie K, PA-C  LamoTRIgine (LAMICTAL PO) Take by mouth.    [provider]  METFORMIN HCL PO Take by mouth.    [provider]  SitaGLIPtin Phosphate (JANUVIA PO) Take by mouth.    [provider]  tobramycin (TOBREX) 0.3 % ophthalmic solution every 4 (four) hours.    [provider]    Physical Exam: Vitals:   08/22/18 1545 08/22/18 1600 08/22/18 1615 08/22/18 1736  BP: 137/83 (!) 143/77 135/82 (!) 149/86  Pulse: 65 66 66 66  Resp: 16 15 17    Temp:    97.7 F (36.5 C)  TempSrc:    Oral  SpO2: 100% 100% 100% 100%  Weight:    81.8  kg  Height:    5' 2.5" (1.588 m)     Constitutional: NAD, calm  Eyes: PERTLA, lids and conjunctivae normal ENMT: Mucous membranes are moist. Posterior pharynx clear of any exudate or lesions.   Neck: normal, supple, no masses, no thyromegaly Respiratory: clear to auscultation bilaterally, no wheezing, no crackles. Normal respiratory effort.    Cardiovascular: S1 & S2 heard, regular rate and rhythm. Trace pedal edema bilaterally. Abdomen: No distension, no  tenderness, soft. Bowel sounds normal.  Musculoskeletal: no clubbing / cyanosis. No joint deformity upper and lower extremities.   Skin: no significant rashes, lesions, ulcers. Warm, dry, well-perfused. Neurologic: CN 2-12 grossly intact. Resting LUE tremor. Strength 5/5 in all 4 limbs.  Psychiatric: Alert and oriented to person, place, and situation. Calm, cooperative.    Labs on Admission: I have personally reviewed following labs and imaging studies  CBC: Recent Labs  Lab 08/22/18 1344  WBC 3.4*  NEUTROABS 2.4  HGB 13.3  HCT 42.8  MCV 92.8  PLT 143*   Basic Metabolic Panel: Recent Labs  Lab 08/22/18 1344  NA 137  K 4.4  CL 97*  CO2 31  GLUCOSE 127*  BUN 22  CREATININE 1.41*  CALCIUM 9.2   GFR: Estimated Creatinine Clearance: 37.7 mL/min (A) (by C-G formula based on SCr of 1.41 mg/dL (H)). Liver Function Tests: Recent Labs  Lab 08/22/18 1344  AST 18  ALT <5  ALKPHOS 90  BILITOT 0.7  PROT 7.5  ALBUMIN 4.2   No results for input(s): LIPASE, AMYLASE in the last 168 hours. No results for input(s): AMMONIA in the last 168 hours. Coagulation Profile: Recent Labs  Lab 08/22/18 1344  INR 1.0   Cardiac Enzymes: No results for input(s): CKTOTAL, CKMB, CKMBINDEX, TROPONINI in the last 168 hours. BNP (last 3 results) No results for input(s): PROBNP in the last 8760 hours. HbA1C: No results for input(s): HGBA1C in the last 72 hours. CBG: Recent Labs  Lab 08/22/18 1335 08/22/18 1801  GLUCAP 103* 67*   Lipid Profile: No results for input(s): CHOL, HDL, LDLCALC, TRIG, CHOLHDL, LDLDIRECT in the last 72 hours. Thyroid Function Tests: No results for input(s): TSH, T4TOTAL, FREET4, T3FREE, THYROIDAB in the last 72 hours. Anemia Panel: No results for input(s): VITAMINB12, FOLATE, FERRITIN, TIBC, IRON, RETICCTPCT in the last 72 hours. Urine analysis:    Component Value Date/Time   COLORURINE YELLOW 12/02/2012 1907   APPEARANCEUR CLEAR 12/02/2012 1907    LABSPEC 1.025 12/02/2012 1907   PHURINE 5.5 12/02/2012 1907   GLUCOSEU NEGATIVE 12/02/2012 1907   HGBUR NEGATIVE 12/02/2012 1907   BILIRUBINUR NEGATIVE 12/02/2012 1907   KETONESUR NEGATIVE 12/02/2012 1907   PROTEINUR 30 (A) 12/02/2012 1907   UROBILINOGEN 1.0 12/02/2012 1907   NITRITE NEGATIVE 12/02/2012 1907   LEUKOCYTESUR SMALL (A) 12/02/2012 1907   Sepsis Labs: @LABRCNTIP (procalcitonin:4,lacticidven:4) )No results found for this or any previous visit (from the past 240 hour(s)).   Radiological Exams on Admission: Ct Head Code Stroke Wo Contrast  Result Date: 08/22/2018 CLINICAL DATA:  Code stroke. Sudden onset headache, ataxia, and confusion. EXAM: CT HEAD WITHOUT CONTRAST TECHNIQUE: Contiguous axial images were obtained from the base of the skull through the vertex without intravenous contrast. COMPARISON:  None available. FINDINGS: Brain: No evidence of acute infarction, hemorrhage, hydrocephalus, extra-axial collection or mass lesion/mass effect. Remote appearing lacunar infarct in the right basal ganglia. Mild cerebral volume loss. Vascular: No hyperdense vessel. There is atherosclerotic calcification. Skull: No acute or aggressive finding Sinuses/Orbits: Postoperative paranasal sinuses  with mild mucosal thickening. Other: These results were called by telephone at the time of interpretation on 08/22/2018 at 1:47 pm to Dr. Alona Bene , who verbally acknowledged these results. ASPECTS [facility] Stroke Program Early CT Score) Not scored with this symptomatology IMPRESSION: 1. No acute finding. 2. Remote appearing right basal ganglia infarct. Electronically Signed   By: Marnee Spring M.D.   On: 08/22/2018 13:48    EKG: Independently reviewed. Sinus rhythm, non-specific ST abnormality inferiorly.   Assessment/Plan   1. Transient right-sided weakness, headache, confusion  - Presents after an episode of headache, dizziness, RUE weakness, and confusion  - She reports being back to her usual  state by time of admission  - CT head with no acute findings, but notable for remote-appearing right basal ganglia infarction  - Teleneurologist evaluated patient in ED  - Check MRI brain, MRA head, carotid US, A1c, and fasting lipids  - Continue cardiac monitoring, frequent neuro checks, PT/OT/SLP evals, and continue ASA    2. Seizures  - None in years per recent neurology office notes  - Med rec pending    3. Parkinson's  - Followed [facility] neurologist with [facility] - Med rec pending   4. Type II DM  - No recent A1c available  - Check CBG's and use a low-intensity SSI with Novolog while in hospital   5. CKD stage III  - SCr is 1.41 on admission, baseline unclear, up from 1.23 two years ago  - Renally-dose medications    6. Hypertension  - BP at goal, will permit HTN for now while evaluating for possible acute ischemic CVA     DVT prophylaxis: sq heparin  Code Status: Full  Family Communication: Family updated at bedside  Consults called: Neurology  Admission status: Observation     Briscoe Deutscher, MD [facility] Pager [phone]  If 7PM-7AM, please contact night-coverage [email or web address] Password [email or web address]  08/22/2018, 8:12 PM

## 2018-08-22 NOTE — ED Notes (Signed)
Neurotele on screen

## 2018-08-22 NOTE — ED Notes (Signed)
Patient transported to CT 

## 2018-08-22 NOTE — ED Provider Notes (Signed)
Emergency Department Provider Note   I have reviewed the triage vital signs and the nursing notes.   HISTORY  Chief Complaint Dizziness   HPI Sara Raymond is a 68 y.o. female with PMH of HTN, seizure, and DM presents to the emergency department with acute onset dizziness, confusion, neck discomfort.  Patient is not anticoagulated.  She was last normal at 12:15 PM and accompanied by her husband who was with her at church.  She arrives by private vehicle.  She denies feeling numbness or weakness but exam is limited by her confusion.  Level 5 caveat applies.  Husband denies any anticoagulation and states that prior to this she was feeling well.  She is usually awake, alert, and active.   In asking the patient specifically about her neck symptoms she reports feeling heaviness in her head and neck without sudden onset, maximal intensity pain.  She states that she began to feel very lightheaded and a strong vertigo sensation.  She feels off balance and confused.  She no longer has any neck or head symptoms.     Past Medical History:  Diagnosis Date  . Diabetes mellitus without complication (HCC)   . Hypertension   . Sarcoidosis   . Seizures [facility])     Patient Active Problem List   Diagnosis Date Noted  . TIA (transient ischemic attack) 08/22/2018  . Seizures (HCC) 08/22/2018  . Hypertension 08/22/2018  . 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 CKD (chronic kidney disease), stage III (HCC) 08/22/2018  . Parkinson disease (HCC) 08/22/2018    History reviewed. No pertinent surgical history.  Allergies Patient has no known allergies.  History reviewed. No pertinent family history.  Social History Social History   Tobacco Use  . Smoking status: Never Smoker  Substance Use Topics  . Alcohol use: No  . Drug use: No    Review of Systems  Level 5 caveat: Confusion.   ____________________________________________   PHYSICAL  EXAM:  VITAL SIGNS: Vitals:   08/22/18 1615 08/22/18 1736  BP: 135/82 (!) 149/86  Pulse: 66 66  Resp: 17   Temp:  97.7 F (36.5 C)  SpO2: 100% 100%    Constitutional: Alert but confused and slow to respond at times. No acute distress. Eyes: Conjunctivae are normal. PERRL. EOMI. Head: Atraumatic. Nose: No congestion/rhinnorhea. Mouth/Throat: Mucous membranes are moist.   Neck: No stridor.  Cardiovascular: Normal rate, regular rhythm. Good peripheral circulation. Grossly normal heart sounds.   Respiratory: Normal respiratory effort.  No retractions. Lungs CTAB. Gastrointestinal: Soft and nontender. No distention.  Musculoskeletal: No lower extremity tenderness nor edema. No gross deformities of extremities. Neurologic:  Normal speech and language. Hesitant, unsteady gait with transfer from the wheelchair to the stretcher. Decreased sensation in the RUE. No pronator drift or weakness in the extremities. CN exam 2-12 normal.  Skin:  Skin is warm, dry and intact. No rash noted.  ____________________________________________   LABS (all labs ordered are listed, but only abnormal results are displayed)  Labs Reviewed  CBC - Abnormal; Notable for the following components:      Result Value   WBC 3.4 (*)    Platelets 143 (*)    All other components within normal limits  DIFFERENTIAL - Abnormal; Notable for the following components:   Lymphs Abs 0.4 (*)    All other components within normal limits  COMPREHENSIVE METABOLIC PANEL - Abnormal; Notable for the following components:   Chloride 97 (*)  Glucose, Bld 127 (*)    Creatinine, Ser 1.41 (*)    GFR calc non Af Amer 38 (*)    GFR calc Af Amer 44 (*)    All other components within normal limits  GLUCOSE, CAPILLARY - Abnormal; Notable for the following components:   Glucose-Capillary 67 (*)    All other components within normal limits  CBG MONITORING, ED - Abnormal; Notable for the following components:   Glucose-Capillary  103 (*)    All other components within normal limits  ETHANOL  PROTIME-INR  APTT  RAPID URINE DRUG SCREEN, HOSP PERFORMED  URINALYSIS, ROUTINE W REFLEX MICROSCOPIC  HIV ANTIBODY (ROUTINE TESTING W REFLEX)  HEMOGLOBIN A1C  LIPID PANEL   ____________________________________________  EKG   EKG Interpretation  Date/Time:  Sunday August 22 2018 13:46:03 EDT Ventricular Rate:  73 PR Interval:    QRS Duration: 79 QT Interval:  433 QTC Calculation: 478 R Axis:   71 Text Interpretation:  Sinus rhythm Nonspecific ST changes inferiorly. No reciprocal changes.  No STEMI.  Confirmed by Alona Bene [phone]) on 08/22/2018 1:50:37 PM       ____________________________________________  RADIOLOGY  Ct Head Code Stroke Wo Contrast  Result Date: 08/22/2018 CLINICAL DATA:  Code stroke. Sudden onset headache, ataxia, and confusion. EXAM: CT HEAD WITHOUT CONTRAST TECHNIQUE: Contiguous axial images were obtained from the base of the skull through the vertex without intravenous contrast. COMPARISON:  None available. FINDINGS: Brain: No evidence of acute infarction, hemorrhage, hydrocephalus, extra-axial collection or mass lesion/mass effect. Remote appearing lacunar infarct in the right basal ganglia. Mild cerebral volume loss. Vascular: No hyperdense vessel. There is atherosclerotic calcification. Skull: No acute or aggressive finding Sinuses/Orbits: Postoperative paranasal sinuses with mild mucosal thickening. Other: These results were called by telephone at the time of interpretation on 08/22/2018 at 1:47 pm to Dr. Alona Bene , who verbally acknowledged these results. ASPECTS [facility] Stroke Program Early CT Score) Not scored with this symptomatology IMPRESSION: 1. No acute finding. 2. Remote appearing right basal ganglia infarct. Electronically Signed   By: Marnee Spring M.D.   On: 08/22/2018 13:48    ____________________________________________   PROCEDURES  Procedure(s) performed:    Procedures  CRITICAL CARE Performed by: Maia Plan Total critical care time: 35 minutes Critical care time was exclusive of separately billable procedures and treating other patients. Critical care was necessary to treat or prevent imminent or life-threatening deterioration. Critical care was time spent personally by me on the following activities: development of treatment plan with patient and/or surrogate as well as nursing, discussions with consultants, evaluation of patient's response to treatment, examination of patient, obtaining history from patient or surrogate, ordering and performing treatments and interventions, ordering and review of laboratory studies, ordering and review of radiographic studies, pulse oximetry and re-evaluation of patient's condition.  Alona Bene, MD Emergency Medicine  ____________________________________________   INITIAL IMPRESSION / ASSESSMENT AND PLAN / ED COURSE  Pertinent labs & imaging results that were available during my care of the patient were reviewed by me and considered in my medical decision making (see chart for details).  Patient presents to the emergency department with acute onset headache with confusion and dizziness.  She is unstable on her feet in the emergency department and is very delayed in answering questions.  Speech is not particularly fluent.  I do not appreciate any facial droop or unilateral deficit but my concern for stroke is elevated.  Given her last seen normal I did activate a code stroke and  have sent the patient for CT imaging of the head.  She will return for lab work and further evaluation by the tele-neurologist.  Neuro does not advise tPA. Patient with improved symptoms since arrival in the ED. CT and labs reviewed. Plan for admit for CVA w/u.   Discussed patient's case with Hospitalist to request admission. Patient and family (if present) updated with plan. Care transferred to Hospitalist service.  I  reviewed all nursing notes, vitals, pertinent old records, EKGs, labs, imaging (as available).  ____________________________________________  FINAL CLINICAL IMPRESSION(S) / ED DIAGNOSES  Final diagnoses:  Stroke-like symptoms     MEDICATIONS GIVEN DURING THIS VISIT:  Medications   stroke: mapping our early stages of recovery book (has no administration in time range)  0.9 %  sodium chloride infusion (90 mL/hr Intravenous Rate/Dose Change 08/22/18 1932)  acetaminophen (TYLENOL) tablet 650 mg (has no administration in time range)    Or  acetaminophen (TYLENOL) solution 650 mg (has no administration in time range)    Or  acetaminophen (TYLENOL) suppository 650 mg (has no administration in time range)  senna-docusate (Senokot-S) tablet 1 tablet (has no administration in time range)  heparin injection 5,000 Units (has no administration in time range)  aspirin suppository 300 mg (has no administration in time range)    Or  aspirin tablet 325 mg (has no administration in time range)  insulin aspart (novoLOG) injection 0-9 Units (has no administration in time range)  insulin aspart (novoLOG) injection 0-5 Units (has no administration in time range)  0.9 %  sodium chloride infusion (125 mL/hr Intravenous New Bag/Given 08/22/18 1821)  aspirin chewable tablet 324 mg (324 mg Oral Given 08/22/18 1511)     Note:  This document was prepared using Dragon voice recognition software and may include unintentional dictation errors.  Alona Bene, MD Emergency Medicine    Long, Arlyss Repress, MD 08/22/18 2006

## 2018-08-22 NOTE — ED Triage Notes (Signed)
[facility] at 1230. Pt at church and had Sudden onset of headache with associated confusion, irregular gate, and weakness to right arm. Pt accompanied by husband.

## 2018-08-22 NOTE — ED Notes (Signed)
Report called to RN on 3 Chad.  [facility] arrived for transport, provided report to transfer team.

## 2018-08-22 NOTE — ED Triage Notes (Signed)
Patient comes into triage for headache and dizziness - patient on arrival has decreased 02 sats but can not remember if she uses O2 at home. Spouse at bedside states that she does use o2 at home. The patient is unsteady on her feet when standing - no noted drift, right handed weakness to grip and confused about simple things that her husband states she normally would know. The patient taken directly to room 4 and Dr. Jacqulyn Bath to the bedside

## 2018-08-22 NOTE — Progress Notes (Signed)
Pt back from MRI 

## 2018-08-22 NOTE — Consult Note (Signed)
[facility] TeleNeurology Consult Services   Date of Service:   08/22/2018 13:39:36  Impression:     .  Rule Out Acute Ischemic Stroke     .  Encephalopathy  Comments/Sign-Out: Patient presented with multiple complaints. On examination no focal deficit was seen patient appeared more encephalopathic and hypophonic on examinaiton. Due to non disabling symptoms and alternative diagnosis. tpA wasn't offered.  Metrics: Last Known Well: 08/22/2018 12:15:00 [facility] Notification Time: 08/22/2018 13:39:36 Arrival Time: 08/22/2018 13:24:00 Stamp Time: 08/22/2018 13:39:36 Time First Login Attempt: 08/22/2018 13:46:58 Video Start Time: 08/22/2018 13:46:58  Symptoms: Dizziness, Confusion NIHSS Start Assessment Time: 08/22/2018 13:48:46 Patient is not a candidate for tPA. Patient was not deemed candidate for tPA thrombolytics because of Resolved symptoms (no residual disabling symptoms). Video End Time: 08/22/2018 13:59:12  CT head showed no acute hemorrhage or acute core infarct.  Clinical Presentation is not Suggestive of Large Vessel Occlusive Disease, Patient is not a Candidate for Thrombectomy  ED Physician notified of diagnostic impression and management plan on 08/22/2018 13:55:40  Our recommendations are outlined below.  Recommendations:     .  Activate Stroke Protocol Admission/Order Set     .  Stroke/Telemetry Floor     .  Neuro Checks     .  Bedside Swallow Eval     .  DVT Prophylaxis     .  IV Fluids, Normal Saline     .  Head of Bed Below 30 Degrees     .  Euglycemia and Avoid Hyperthermia (PRN Acetaminophen)     .  Antiplatelet Therapy Recommended  Routine Consultation with Inhouse Neurology for Follow up Care  Sign Out:     .  Discussed with Emergency Department Provider    ------------------------------------------------------------------------------  History of Present Illness: Patient is a 68 year old Female.  Patient was brought by private  transportation with symptoms of Dizziness, Confusion  PMH of Epilepsy, DM presents with constellation of symptoms including dizziness, confusion  CT head showed no acute hemorrhage or acute core infarct.  Last seen normal was within 4.5 hours. There is no history of hemorrhagic complications or intracranial hemorrhage. There is no history of Recent Anticoagulants. There is no history of recent major surgery. There is no history of recent stroke.  Examination: 1A: Level of Consciousness - Arouses to minor stimulation + 1 1B: Ask Month and Age - Both Questions Right + 0 1C: Blink Eyes & Squeeze Hands - Performs Both Tasks + 0 2: Test Horizontal Extraocular Movements - Normal + 0 3: Test Visual Fields - No Visual Loss + 0 4: Test Facial Palsy (Use Grimace if Obtunded) - Normal symmetry + 0 5A: Test Left Arm Motor Drift - No Drift for 10 Seconds + 0 5B: Test Right Arm Motor Drift - No Drift for 10 Seconds + 0 6A: Test Left Leg Motor Drift - No Drift for 5 Seconds + 0 6B: Test Right Leg Motor Drift - No Drift for 5 Seconds + 0 7: Test Limb Ataxia (FNF/Heel-Shin) - No Ataxia + 0 8: Test Sensation - Mild-Moderate Loss: Less Sharp/More Dull + 1 9: Test Language/Aphasia - Normal; No aphasia + 0 10: Test Dysarthria - Normal + 0 11: Test Extinction/Inattention - No abnormality + 0  NIHSS Score: 2  Patient was informed the Neurology Consult would happen via TeleHealth consult by way of interactive audio and video telecommunications and consented to receiving care in this manner.  Due to the immediate potential for life-threatening deterioration  due to underlying acute neurologic illness, I spent 35 minutes providing critical care. This time includes time for face to face visit via telemedicine, review of medical records, imaging studies and discussion of findings with providers, the patient and/or family.   Dr Letta Kocher Anakaren Campion   [facility] [phone]  Case [id number]

## 2018-08-22 NOTE — Consult Note (Signed)
Neurology Consultation Reason for Consult: Transient right-sided weakness Referring Physician: Opyd, T  CC: Transient right-sided weakness  History is obtained from: Patient  HPI: Sara Raymond is a 68 y.o. female with a history of diabetes, hypertension, seizure disorder who was in her normal state of health while singing at church today.  She states that she suddenly felt pain on the left side of her neck and then felt lightheaded and noticed that her right side became weak.  This lasted for approximately 30 minutes.  She did notice some tremor, however this is not unusual for her given her diagnosis of Parkinson's disease.  She currently feels that she is essentially back to her normal self.   ROS: A 14 point ROS was performed and is negative except as noted in the HPI.   Past Medical History:  Diagnosis Date  . Diabetes mellitus without complication (HCC)   . Hypertension   . Sarcoidosis   . Seizures (HCC)      Family History  Problem Relation Age of Onset  . Hypertension Other      Social History:  reports that she has never smoked. She has never used smokeless tobacco. She reports that she does not drink alcohol or use drugs.   Exam: Current vital signs: BP 138/82 (BP Location: Right Leg)   Pulse 81   Temp 98.7 F (37.1 C) (Oral)   Resp 19   Ht 5' 2.5" (1.588 m)   Wt 81.8 kg   SpO2 95%   BMI 32.46 kg/m  Vital signs in last 24 hours: Temp:  [97.7 F (36.5 C)-98.7 F (37.1 C)] 98.7 F (37.1 C) (03/08 2202) Pulse Rate:  [64-81] 81 (03/08 2202) Resp:  [14-19] 19 (03/08 2202) BP: (129-149)/(69-107) 138/82 (03/08 2202) SpO2:  [95 %-100 %] 95 % (03/08 2202) Weight:  [81.8 kg-81.9 kg] 81.8 kg (03/08 1736)   Physical Exam  Constitutional: Appears well-developed and well-nourished.  Psych: Affect appropriate to situation Eyes: No scleral injection HENT: No OP obstrucion Head: Normocephalic.  Cardiovascular: Normal rate and regular rhythm.  Respiratory:  Effort normal, non-labored breathing GI: Soft.  No distension. There is no tenderness.  Skin: WDI  Neuro: Mental Status: Patient is awake, alert, oriented to person, place, month, year, and situation. Patient is able to give a clear and coherent history. No signs of aphasia or neglect Cranial Nerves: II: Visual Fields are full. Pupils are equal, round, and reactive to light.   III,IV, VI: EOMI without ptosis or diploplia.  V: Facial sensation is symmetric to temperature VII: Facial movement is symmetric.  VIII: hearing is intact to voice X: Uvula elevates symmetrically XI: Shoulder shrug is symmetric. XII: tongue is midline without atrophy or fasciculations.  Motor: Tone is is not markedly increased.  Bulk is normal. 5/5 strength was present in all four extremities.  Sensory: Sensation is slightly diminished on the right hand compared to the left otherwise intact Cerebellar: Mild postural and resting tremor, resting portion is worse on the right  I have reviewed labs in epic and the results pertinent to this consultation are: Creatinine of 1.4  I have reviewed the images obtained: CT head- old strokes, but nothing acute  Impression: 68 year old female with a history of previous stroke as well as seizure disorder who presents with transient episode of right-sided weakness that lasted for approximately 30 minutes.  I think that this is most likely consistent with TIA, however given the description of lightheadedness and question of some tremor I would favor  getting an EEG.  If this does show significant cortical irritability, then may consider seizure as an etiology for this event.  She does have a history of strokes, and I would favor treating this event as a TIA unless the EEG is positive.  Recommendations: - HgbA1c, fasting lipid panel - MRI, MRA  of the brain without contrast - Frequent neuro checks - Echocardiogram - Carotid dopplers - Prophylactic therapy-Antiplatelet med:  Aspirin - dose 325mg  PO or 300mg  PR - Risk factor modification - Telemetry monitoring - PT consult, OT consult, Speech consult - Stroke team to follow -EEG   Ritta Slot, MD [facility] [phone]  If 7pm- 7am, please page neurology on call as listed in AMION.

## 2018-08-23 ENCOUNTER — Inpatient Hospital Stay (HOSPITAL_COMMUNITY): Payer: Medicare Other

## 2018-08-23 ENCOUNTER — Encounter (HOSPITAL_COMMUNITY): Payer: Medicare Other

## 2018-08-23 ENCOUNTER — Observation Stay (HOSPITAL_COMMUNITY): Payer: Medicare Other

## 2018-08-23 DIAGNOSIS — J9611 Chronic respiratory failure with hypoxia: Secondary | ICD-10-CM | POA: Diagnosis present

## 2018-08-23 DIAGNOSIS — Z9981 Dependence on supplemental oxygen: Secondary | ICD-10-CM | POA: Diagnosis not present

## 2018-08-23 DIAGNOSIS — I129 Hypertensive chronic kidney disease with stage 1 through stage 4 chronic kidney disease, or unspecified chronic kidney disease: Secondary | ICD-10-CM | POA: Diagnosis present

## 2018-08-23 DIAGNOSIS — Z8673 Personal history of transient ischemic attack (TIA), and cerebral infarction without residual deficits: Secondary | ICD-10-CM | POA: Diagnosis not present

## 2018-08-23 DIAGNOSIS — G43109 Migraine with aura, not intractable, without status migrainosus: Secondary | ICD-10-CM | POA: Diagnosis present

## 2018-08-23 DIAGNOSIS — E669 Obesity, unspecified: Secondary | ICD-10-CM | POA: Diagnosis present

## 2018-08-23 DIAGNOSIS — I10 Essential (primary) hypertension: Secondary | ICD-10-CM | POA: Diagnosis not present

## 2018-08-23 DIAGNOSIS — R29702 NIHSS score 2: Secondary | ICD-10-CM | POA: Diagnosis present

## 2018-08-23 DIAGNOSIS — G459 Transient cerebral ischemic attack, unspecified: Secondary | ICD-10-CM

## 2018-08-23 DIAGNOSIS — J439 Emphysema, unspecified: Secondary | ICD-10-CM | POA: Diagnosis present

## 2018-08-23 DIAGNOSIS — N183 Chronic kidney disease, stage 3 (moderate): Secondary | ICD-10-CM | POA: Diagnosis present

## 2018-08-23 DIAGNOSIS — I639 Cerebral infarction, unspecified: Secondary | ICD-10-CM | POA: Diagnosis present

## 2018-08-23 DIAGNOSIS — Z6832 Body mass index (BMI) 32.0-32.9, adult: Secondary | ICD-10-CM | POA: Diagnosis not present

## 2018-08-23 DIAGNOSIS — R2990 Unspecified symptoms and signs involving the nervous system: Secondary | ICD-10-CM | POA: Diagnosis present

## 2018-08-23 DIAGNOSIS — G40909 Epilepsy, unspecified, not intractable, without status epilepticus: Secondary | ICD-10-CM | POA: Diagnosis present

## 2018-08-23 DIAGNOSIS — G20 Parkinson's disease: Secondary | ICD-10-CM | POA: Diagnosis present

## 2018-08-23 DIAGNOSIS — M5481 Occipital neuralgia: Secondary | ICD-10-CM | POA: Diagnosis present

## 2018-08-23 DIAGNOSIS — Z7984 Long term (current) use of oral hypoglycemic drugs: Secondary | ICD-10-CM | POA: Diagnosis not present

## 2018-08-23 DIAGNOSIS — E785 Hyperlipidemia, unspecified: Secondary | ICD-10-CM | POA: Diagnosis present

## 2018-08-23 DIAGNOSIS — D61818 Other pancytopenia: Secondary | ICD-10-CM | POA: Diagnosis present

## 2018-08-23 DIAGNOSIS — D869 Sarcoidosis, unspecified: Secondary | ICD-10-CM | POA: Diagnosis present

## 2018-08-23 DIAGNOSIS — R531 Weakness: Secondary | ICD-10-CM | POA: Diagnosis present

## 2018-08-23 DIAGNOSIS — R200 Anesthesia of skin: Secondary | ICD-10-CM | POA: Diagnosis present

## 2018-08-23 DIAGNOSIS — E1122 Type 2 diabetes mellitus with diabetic chronic kidney disease: Secondary | ICD-10-CM | POA: Diagnosis present

## 2018-08-23 DIAGNOSIS — I6621 Occlusion and stenosis of right posterior cerebral artery: Secondary | ICD-10-CM | POA: Diagnosis present

## 2018-08-23 LAB — BASIC METABOLIC PANEL
Anion gap: 6 (ref 5–15)
BUN: 21 mg/dL (ref 8–23)
CALCIUM: 8.5 mg/dL — AB (ref 8.9–10.3)
CO2: 31 mmol/L (ref 22–32)
Chloride: 101 mmol/L (ref 98–111)
Creatinine, Ser: 1.13 mg/dL — ABNORMAL HIGH (ref 0.44–1.00)
GFR calc Af Amer: 57 mL/min — ABNORMAL LOW (ref >60)
GFR, EST NON AFRICAN AMERICAN: 50 mL/min — AB (ref >60)
Glucose, Bld: 122 mg/dL — ABNORMAL HIGH (ref 70–99)
Potassium: 4.5 mmol/L (ref 3.5–5.1)
Sodium: 138 mmol/L (ref 135–145)

## 2018-08-23 LAB — CBC
HCT: 38.5 % (ref 36.0–46.0)
Hemoglobin: 11.7 g/dL — ABNORMAL LOW (ref 12.0–15.0)
MCH: 28.1 pg (ref 26.0–34.0)
MCHC: 30.4 g/dL (ref 30.0–36.0)
MCV: 92.3 fL (ref 80.0–100.0)
Platelets: 121 10*3/uL — ABNORMAL LOW (ref 150–400)
RBC: 4.17 MIL/uL (ref 3.87–5.11)
RDW: 13.1 % (ref 11.5–15.5)
WBC: 2.5 10*3/uL — ABNORMAL LOW (ref 4.0–10.5)
nRBC: 0 % (ref 0.0–0.2)

## 2018-08-23 LAB — LIPID PANEL
Cholesterol: 236 mg/dL — ABNORMAL HIGH (ref 0–200)
HDL: 45 mg/dL (ref >40)
LDL CALC: 167 mg/dL — AB (ref 0–99)
Total CHOL/HDL Ratio: 5.2 RATIO
Triglycerides: 120 mg/dL (ref <150)
VLDL: 24 mg/dL (ref 0–40)

## 2018-08-23 LAB — GLUCOSE, CAPILLARY
Glucose-Capillary: 122 mg/dL — ABNORMAL HIGH (ref 70–99)
Glucose-Capillary: 147 mg/dL — ABNORMAL HIGH (ref 70–99)
Glucose-Capillary: 116 mg/dL — ABNORMAL HIGH (ref 70–99)

## 2018-08-23 LAB — HEMOGLOBIN A1C
Hgb A1c MFr Bld: 6.7 % — ABNORMAL HIGH (ref 4.8–5.6)
Mean Plasma Glucose: 145.59 mg/dL

## 2018-08-23 LAB — HIV ANTIBODY (ROUTINE TESTING W REFLEX): HIV Screen 4th Generation wRfx: NONREACTIVE

## 2018-08-23 LAB — ECHOCARDIOGRAM COMPLETE
Height: 62.5 in
Weight: 2885.38 oz

## 2018-08-23 MED ORDER — GABAPENTIN 400 MG PO CAPS
400.0000 mg | ORAL_CAPSULE | Freq: Three times a day (TID) | ORAL | Status: DC
  Administered 2018-08-23 (×2): 400 mg via ORAL
  Filled 2018-08-23 (×3): qty 1

## 2018-08-23 MED ORDER — IOPAMIDOL (ISOVUE-370) INJECTION 76%
50.0000 mL | Freq: Once | INTRAVENOUS | Status: AC | PRN
  Administered 2018-08-23: 50 mL via INTRAVENOUS

## 2018-08-23 MED ORDER — ATORVASTATIN CALCIUM 80 MG PO TABS
80.0000 mg | ORAL_TABLET | Freq: Every day | ORAL | Status: DC
  Administered 2018-08-23: 80 mg via ORAL
  Filled 2018-08-23: qty 1

## 2018-08-23 MED ORDER — IOPAMIDOL (ISOVUE-300) INJECTION 61%
INTRAVENOUS | Status: AC
  Administered 2018-08-23: 13:00:00
  Filled 2018-08-23: qty 50

## 2018-08-23 MED ORDER — ASPIRIN EC 325 MG PO TBEC: 325.0000 mg | DELAYED_RELEASE_TABLET | Freq: Every day | ORAL | 0 refills | Status: DC

## 2018-08-23 MED ORDER — ATORVASTATIN CALCIUM 80 MG PO TABS: 80.0000 mg | ORAL_TABLET | Freq: Every day | ORAL | 0 refills | Status: DC

## 2018-08-23 MED ORDER — IOPAMIDOL (ISOVUE-370) INJECTION 76%
INTRAVENOUS | Status: AC
  Administered 2018-08-23: 13:00:00
  Filled 2018-08-23: qty 50

## 2018-08-23 NOTE — Progress Notes (Signed)
EEG completed; results pending.    

## 2018-08-23 NOTE — Progress Notes (Signed)
Patient on room air at rest 90% o2, patient ambulatilg in hall on room air 70% o2, 4 liters o2 while ambulating maintain o2 at 91%

## 2018-08-23 NOTE — Progress Notes (Signed)
  Echocardiogram 2D Echocardiogram has been performed.  Belva Chimes 08/23/2018, 11:37 AM

## 2018-08-23 NOTE — Progress Notes (Addendum)
STROKE TEAM PROGRESS NOTE   SUBJECTIVE (INTERVAL HISTORY) Her daughter and the husband are at the bedside.  Patient stated that yesterday she had a sudden onset neck pain at the back of neck, shooting pain to bilateral vertex, as well as lightheadedness, and bilateral facial numbness.  She also felt some right arm numbness.  This all resolved after 30 minutes.   OBJECTIVE Vitals:   08/23/18 0403 08/23/18 0603 08/23/18 0830 08/23/18 1154  BP: (!) 148/85  (!) 150/75   Pulse: 77  72   Resp: 19 18    Temp: 97.9 F (36.6 C) 97.9 F (36.6 C) 98.6 F (37 C) 98.6 F (37 C)  TempSrc: Oral Oral    SpO2: 96%  100% 95%  Weight:      Height:        CBC:  Recent Labs  Lab 08/22/18 1344 08/23/18 0740  WBC 3.4* 2.5*  NEUTROABS 2.4  --   HGB 13.3 11.7*  HCT 42.8 38.5  MCV 92.8 92.3  PLT 143* 121*    Basic Metabolic Panel:  Recent Labs  Lab 08/22/18 1344 08/23/18 0740  NA 137 138  K 4.4 4.5  CL 97* 101  CO2 31 31  GLUCOSE 127* 122*  BUN 22 21  CREATININE 1.41* 1.13*  CALCIUM 9.2 8.5*    Lipid Panel:     Component Value Date/Time   CHOL 236 (H) 08/23/2018 0529   TRIG 120 08/23/2018 0529   HDL 45 08/23/2018 0529   CHOLHDL 5.2 08/23/2018 0529   VLDL 24 08/23/2018 0529   LDLCALC 167 (H) 08/23/2018 0529   HgbA1c:  Lab Results  Component Value Date   HGBA1C 6.7 (H) 08/23/2018   Urine Drug Screen: No results found for: LABOPIA, COCAINSCRNUR, LABBENZ, AMPHETMU, THCU, LABBARB  Alcohol Level     Component Value Date/Time   ETH <10 08/22/2018 1344    IMAGING  Ct Angio Head W Or Wo Contrast Ct Angio Neck W Or Wo Contrast 08/23/2018 IMPRESSION:  1. No large vessel occlusion.  2. Intracranial atherosclerosis resulting in mild bilateral ICA, severe left M2, severe right A2, and severe right P2 stenoses.  3. Moderate cervical carotid artery atherosclerosis without stenosis. 4. Chronic thoracic lymphadenopathy and lung changes consistent with the history of sarcoidosis.   5.  Aortic Atherosclerosis (ICD10-I70.0).    Mr Maxine Glenn Head Wo Contrast 08/22/2018 IMPRESSION:  1. "no evidence for acute or subacute infarction" 2. Remote lacunar infarcts of the basal ganglia and internal capsule bilaterally, right greater than left.  3. Moderate diffuse white matter disease likely reflects the sequela of chronic microvascular ischemia.  4. Endoscopic nasal surgery some residual mucosal disease through the ethmoid air cells.  5. Narrowing of the distal left M1 segment without significant attenuation of left MCA branch vessels.  6. Right greater than left medium and distal small vessel disease of the MCA branches.  7. Moderate distal A2 segment stenoses.  8. Moderate stenosis of the right PCA with asymmetric attenuation of right PCA branches.    Ct Head Code Stroke Wo Contrast 08/22/2018 IMPRESSION:  1. No acute finding. 2. Remote appearing right basal ganglia infarct.    Transthoracic Echocardiogram   1. The left ventricle has normal systolic function, with an ejection fraction of 55-60%. The cavity size was normal. There is mildly increased left ventricular wall thickness. Left ventricular diastolic Doppler parameters are consistent with impaired  relaxation.  2. The right ventricle has normal systolic function. The cavity was normal. There is  mildly increased right ventricular wall thickness.  3. Left atrial size was mildly dilated.  4. There is mild mitral annular calcification present. No evidence of mitral valve stenosis. Trivial mitral regurgitation.  5. The aortic valve is tricuspid Mild calcification of the aortic valve. no stenosis of the aortic valve.  6. The aortic root and ascending aorta are normal in size and structure.  7. Normal IVC size. PA systolic pressure 32 mmHg.   EEG Normal electroencephalogram, awake and drowsy. There are no focal lateralizing or epileptiform features.    PHYSICAL EXAM  Temp:  [97.7 F (36.5 C)-98.7 F (37.1 C)] 98.6 F  (37 C) (03/09 1154) Pulse Rate:  [66-81] 72 (03/09 0830) Resp:  [18-19] 18 (03/09 0603) BP: (122-150)/(71-86) 135/75 (03/09 1554) SpO2:  [92 %-100 %] 92 % (03/09 1554) Weight:  [81.8 kg] 81.8 kg (03/08 1736)  General - Well nourished, well developed, in no apparent distress.  Ophthalmologic - fundi not visualized due to noncooperation.  Cardiovascular - Regular rate and rhythm.  Mental Status -  Level of arousal and orientation to time, place, and person were intact. Language including expression, naming, repetition, comprehension was assessed and found intact. Fund of Knowledge was assessed and was intact.  Cranial Nerves II - XII - II - Visual field intact OU. III, IV, VI - Extraocular movements intact. V - Facial sensation intact bilaterally. VII - Facial movement intact bilaterally, masked face. VIII - Hearing & vestibular intact bilaterally. X - Palate elevates symmetrically. XI - Chin turning & shoulder shrug intact bilaterally. XII - Tongue protrusion intact  Motor Strength - The patient's strength was symmetrical in all extremities and pronator drift was absent.  Bulk was normal and fasciculations were absent.   Motor Tone - Muscle tone was assessed at the neck and appendages and was normal.  Reflexes - The patient's reflexes were symmetrical in all extremities and she had no pathological reflexes.  Sensory - Light touch, temperature/pinprick were assessed and were symmetrical.    Coordination - finger-to-nose testing bilaterally was complicated by postural and resting tremor bilaterally.  Tremor was more pronounced on the right arm the left arm.  Gait and Station - deferred.    ASSESSMENT/PLAN Ms. Sara Raymond is a 68 y.o. female with history of diabetes, hypertension, sarcoidosis, seizure disorder and Parkinson's disease presenting with sudden onset of pain on the left side of her neck, lightheadedness and right sided weakness. Sxs resolved in 30 minutes. She  did not receive IV t-PA due to resolution of deficits.  Likely complicated migraine with significant occipital neuralgia. TIA can not be completely ruled out given left M2 severe stenosis  Resultant neck pain with shooting pain at vertex associate with lightheadedness, now all resolved.  CT head - No acute finding. Remote appearing right basal ganglia infarct.   MRI head - "no evidence for acute or subacute infarction".  Remote bilateral BG/IC chronic infarcts, right more than left.  MRA head - Moderate distal A2 segment stenoses. Moderate stenosis of the right PCA with asymmetric attenuation of right PCA branches.   CTA H&N - severe left M2, severe right A2, and severe right P2 stenoses.   2D Echo EF 55 to 60%  EEG normal  LDL - 167  HgbA1c - 6.7  UDS - pending  VTE prophylaxis - [place] Heparin  Diet - Heart healthy / carb modified with thin liquids.  No antithrombotic prior to admission, now on aspirin 325 mg daily.  Continue aspirin on discharge  Patient  counseled to be compliant with her antithrombotic medications  Ongoing aggressive stroke risk factor management  Therapy recommendations:  pending  Disposition:  Pending  Intracranial vascular stenosis  MRA head - Moderate distal A2 segment stenoses. Moderate stenosis of the right PCA with asymmetric attenuation of right PCA branches.   CTA H&N - severe left M2, severe right A2, and severe right P2 stenoses.   Continue aspirin and statin for stroke prevention  Stroke risk factor modification  Continue follow-up with neurologist Dr. Maple Hudson at [facility]  Parkinson's disease  Vascular phase  Bilateral resting and postural tremor  On Sinemet  Continue follow-up with neurology Dr. Maple Hudson at [facility]  Seizure disorder  Continue home medication Lamictal  Follow-up with neurologist Dr. Arletha Grippe at [facility]  Hypertension  Stable . Long-term BP goal 130-150 given intracranial  stenosis  Hyperlipidemia  Lipid lowering medication PTA: none  LDL 167, goal < 70  Current lipid lowering medication: Lipitor 80 mg daily  Continue statin at discharge  Diabetes  HgbA1c 6.7, goal < 7.0  Controlled  SSI  CBG monitoring  Other Stroke Risk Factors  Advanced age  Obesity, Body mass index is 32.46 kg/m., recommend weight loss, diet and exercise as appropriate   Hx stroke/TIA by imaging   Other Active Problems  Leukopenia - 3.4 -> 2.5  Thrombocytopenia - 143 ->121  Elevated creatinine - 1.41 -> 1.13   Hospital day # 0  Neurology will sign off. Please call with questions. Pt will follow up with neurology Dr. Maple Hudson at [facility] in about 4 weeks. Thanks for the consult.  Marvel Plan, MD PhD Stroke Neurology 08/23/2018 5:35 PM   To contact Stroke Continuity provider, please refer to [email or web address]. After hours, contact General Neurology

## 2018-08-23 NOTE — Evaluation (Addendum)
Occupational Therapy Evaluation Patient Details Name: Sara Raymond MRN: [id number] DOB: 10-27-1950 Today's Date: 08/23/2018    History of Present Illness Patient is a 68 y/o female who presents with dizziness, HA and RUE weakness while at church. Admitted for likely TIA vs possible seizure. EEG pending. Brain MRI-acute vs subacute lacunar infarct in basal ganglia and internal capsule bilaterally Rt>left. PMH includes seizures, HTN, DM, sarcoidosis.    Clinical Impression   Patient reports independent and driving PTA.  Admitted for above, limited by problem list below including impaired balance, decreased activity tolerance, and cognition (memory, safety, sequencing).  Pt requires increased time to initiate and process tasks, short blessed test reveals some short term memory and sequencing difficulties but patient reports baseline. She reports wearing oxygen at night PTA, removed during session with noted saturations dropping to 82% during grooming tasks at sink improved to 92% with cueing for pursed lip breathing. Noted B UE tremors, pt reports baseline (early dx of parkinson's? Per pt). Able to complete bed mobility with supervision, basic transfers with min guard assist, LB ADLs with min guard assist.  She will benefit from continued OT services while admitted in order to maximize independence and safety with ADLs/ mobility, but anticipate no further needs after dc.     Follow Up Recommendations  No OT follow up;Supervision/Assistance - 24 hour    Equipment Recommendations  3 in 1 bedside commode    Recommendations for Other Services       Precautions / Restrictions Precautions Precautions: Fall Restrictions Weight Bearing Restrictions: No      Mobility Bed Mobility Overal bed mobility: Needs Assistance Bed Mobility: Supine to Sit     Supine to sit: Supervision;HOB elevated     General bed mobility comments: supervision for safety   Transfers Overall transfer level: Needs  assistance Equipment used: None Transfers: Sit to/from Stand Sit to Stand: Min guard         General transfer comment: min guard for safety     Balance Overall balance assessment: Needs assistance Sitting-balance support: Feet supported;No upper extremity supported Sitting balance-Leahy Scale: Good     Standing balance support: During functional activity;No upper extremity supported Standing balance-Leahy Scale: Fair Standing balance comment: statically min guard at sink during grooming tasks within base of support                           ADL either performed or assessed with clinical judgement   ADL Overall ADL's : Needs assistance/impaired     Grooming: Standing;Min guard;Wash/dry hands;Oral care   Upper Body Bathing: Set up;Sitting   Lower Body Bathing: Min guard;Sit to/from stand   Upper Body Dressing : Set up;Sitting   Lower Body Dressing: Min guard;Sit to/from stand   Toilet Transfer: Min guard;Ambulation;Regular Social worker and Hygiene: Min guard;Sit to/from stand       Functional mobility during ADLs: Min guard General ADL Comments: min guard for safety and balance, decreased activity tolerance with SPO2 monitored throughout session      Vision Baseline Vision/History: Wears glasses Wears Glasses: Reading only Patient Visual Report: No change from baseline Additional Comments: appears [facility]      Perception     Praxis      Pertinent Vitals/Pain Pain Assessment: No/denies pain     Hand Dominance Right   Extremity/Trunk Assessment Upper Extremity Assessment Upper Extremity Assessment: Overall WFL for tasks assessed   Lower Extremity Assessment Lower Extremity Assessment:  Defer to PT evaluation       Communication Communication Communication: No difficulties   Cognition Arousal/Alertness: Awake/alert Behavior During Therapy: Flat affect Overall Cognitive Status: Impaired/Different from  baseline Area of Impairment: Memory;Awareness;Problem solving                     Memory: Decreased short-term memory     Awareness: Emergent Problem Solving: Slow processing;Requires verbal cues General Comments: alert and oriented, requires increased time to process and sequence tasks, Short blessed test completed: 6/28 scored--further assesment required   General Comments  oxgyen removed during session, during mobility and grooming tasks dropped to mid80s, cueing for PLB to reach 92%     Exercises     Shoulder Instructions      Home Living Family/patient expects to be discharged to:: Private residence Living Arrangements: Spouse/significant other Available Help at Discharge: Family;Available 24 hours/day Type of Home: House Home Access: Stairs to enter [employer] of Steps: 3 Entrance Stairs-Rails: Right Home Layout: One level;Laundry or work area in basement     [employer] Shower/Tub: Chief Strategy Officer: Standard     Home Equipment: [employer] - single point          Prior Functioning/Environment Level of Independence: Independent        Comments: Does own ADLs, cooks, cleans and drives. Wears 02 at night per report.         OT Problem List: Decreased activity tolerance;Impaired balance (sitting and/or standing);Decreased cognition;Decreased safety awareness;Decreased knowledge of use of DME or AE;Decreased knowledge of precautions;Cardiopulmonary status limiting activity      OT Treatment/Interventions: Self-care/ADL training;Energy conservation;DME and/or AE instruction;Therapeutic activities;Cognitive remediation/compensation;Patient/family education;Balance training    OT Goals(Current goals can be found in the care plan section) Acute Rehab OT Goals Patient Stated Goal: to go home OT Goal Formulation: With patient Time For Goal Achievement: 09/06/18 Potential to Achieve Goals: Good  OT Frequency: Min 2X/week   Barriers to  D/C:            Co-evaluation              AM-PAC OT "6 Clicks" Daily Activity     Outcome Measure Help from another person eating meals?: None Help from another person taking care of personal grooming?: A Little Help from another person toileting, which includes using toliet, bedpan, or urinal?: A Little Help from another person bathing (including washing, rinsing, drying)?: A Little Help from another person to put on and taking off regular upper body clothing?: None Help from another person to put on and taking off regular lower body clothing?: A Little 6 Click Score: 20   End of Session Equipment Utilized During Treatment: Oxygen;Gait belt Nurse Communication: Mobility status  Activity Tolerance: Patient tolerated treatment well Patient left: in chair;with call bell/phone within reach;with chair alarm set;with family/visitor present;with nursing/sitter in room  OT Visit Diagnosis: Unsteadiness on feet (R26.81)                Time: 4827-0786 OT Time Calculation (min): 37 min Charges:  OT General Charges $OT Visit: 1 Visit OT Evaluation $OT Eval Moderate Complexity: 1 Mod OT Treatments $Self Care/Home Management : 8-22 mins  Chancy Milroy, OT Acute Rehabilitation Services Pager [phone] Office [phone]   Chancy Milroy 08/23/2018, 3:28 PM

## 2018-08-23 NOTE — Procedures (Signed)
ELECTROENCEPHALOGRAM REPORT   Patient: Sara Raymond       Room #: 6R44R EEG No. ID: [id number] Age: 68 y.o.        Sex: female Referring Physician: Hongalgi Report Date:  08/23/2018        Interpreting Physician: Thana Farr  History: Jerolyn Osterlund is an 68 y.o. female with an episode of right sided weakness  Medications:  ASA, Lipitor, Sinemet, Insulin, Lamictal  Conditions of Recording:  This is a 21 channel routine scalp EEG performed with bipolar and monopolar montages arranged in accordance to the international 10/20 system of electrode placement. One channel was dedicated to EKG recording.  The patient is in the awake and drowsy states.  Description:  The waking background activity consists of a low voltage, symmetrical, fairly well organized, 8 Hz alpha activity, seen from the parieto-occipital and posterior temporal regions.  Low voltage fast activity, poorly organized, is seen anteriorly and is at times superimposed on more posterior regions.  A mixture of theta and alpha rhythms are seen from the central and temporal regions. The patient drowses with slowing to irregular, low voltage theta and beta activity.   Stage II sleep is not obtained. No epileptiform activity is noted.   Hyperventilation and intermittent photic stimulation were not performed.   IMPRESSION: Normal electroencephalogram, awake and drowsy. There are no focal lateralizing or epileptiform features.   Thana Farr, MD Neurology [phone] 08/23/2018, 1:28 PM

## 2018-08-23 NOTE — Discharge Instructions (Addendum)

## 2018-08-23 NOTE — Progress Notes (Signed)
SLP Cancellation Note  Patient Details Name: Sara Raymond MRN: [id number] DOB: 1951-04-01   Cancelled treatment:       Reason Eval/Treat Not Completed: Patient at procedure or test/unavailable   Uri Covey, Riley Nearing 08/23/2018, 1:40 PM

## 2018-08-23 NOTE — Progress Notes (Signed)
Patient discharged to home. Husband and daughter present at discharge. IV removed, instructed to consult with the primary doctor.

## 2018-08-23 NOTE — Discharge Summary (Addendum)
Physician Discharge Summary  Sara Raymond PRX:[id number] DOB: 18-Nov-1950  PCP: Angelica Chessman, MD  Admit date: 08/22/2018 Discharge date: 08/23/2018  Recommendations for Outpatient Follow-up:  1. Dr. Bernadette Hoit, PCP in 1 week with repeat labs (CBC & CMP). 2. Dr. Charmayne Sheer, Neurology in 4 weeks. 3. Periodically monitor LFTs while on statins. 4. Patient advised to take all her medications with her during next PCP visit for review.  Home Health: PT Equipment/Devices: Rolling walker with 5 inch wheels, 3 n 1, home oxygen at 4 L/min via nasal cannula  Discharge Condition: Improved and stable CODE STATUS: Full Diet recommendation: Heart healthy diet.  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  She was in her usual state of health and went to church where she developed sudden onset of pain on the left side of her neck, dizziness/lightheadedness, right-sided weakness, some confusion and all of the symptoms resolved in 30 minutes.  Admitted for possible TIA.  Neurology consulted.  Assessment and plan:  1. TIA versus complicated migraine: Neurology was consulted and assisted with evaluation and management.  CT head: No acute finding.  Remote appearing right basal ganglia infarct.  Remote appearing right basal ganglia infarct.  MRI head: No evidence of acute or subacute infarction.  Remote bilateral basal ganglia/internal  capsule infarcts, right more than left.  MRA head: Moderate distal A2 segment stenosis.  Moderate stenosis of the right PCA with asymmetric attenuation of right PCA branches.  CTA head and neck: No large vessel occlusion, mild bilateral ICA, severe left M2, severe right A2 and severe right P2 stenosis, moderate cervical carotid artery atherosclerosis without stenosis.  TTE: LVEF 55-60%.  EEG normal.  LDL 167, A1c 6.7.  BAL <10.  Not on antithrombotics prior to admission, now on aspirin 325 mg daily.  Therapies recommend home health PT and DME as noted above.  Outpatient follow-up with Dr. Maple Hudson, primary neurologist in [facility]. 2. DM2: A1c 6.7, goal <7.  Consider starting metformin as outpatient.  Diet, exercise and weight loss. 3. Hyperlipidemia: LDL 167, goal <70.  Not on statins PTA.  Now on atorvastatin 80 mg daily. 4. Essential hypertension: As per neurology, long-term BP goal 130-150 given intracranial stenosis. 5. Intracranial vascular stenosis: MRA head, CTA head and neck results as noted above.  Neurology input appreciated.  Recommend continuing aspirin and statin for stroke prevention.  Outpatient follow-up with neurology. 6. Parkinson's disease: Continue Rytary.  Has bilateral resting and postural tremor.  Follow-up with primary neurologist as outpatient. 7. Seizure disorder: Patient reports no recent seizures.  Continue home Lamictal.  Follow-up with Dr. Maple Hudson. 8. Obesity/Body mass index is 32.46 kg/m. 9. Pancytopenia: Unclear etiology.  Leukopenia and thrombocytopenia seem chronic.  Anemia probably is chronic too.  Outpatient follow-up with PCP and evaluation as deemed necessary. 10. Stage III chronic kidney disease: Baseline creatinine may be in the 1.1 range.  Presented with creatinine of 1.4 which has returned to 1.1. 11. Sarcoidosis: On CTA head and neck,  noted chronic thoracic lymphadenopathy and lung changes consistent with history of sarcoidosis.  Chest x-ray: Stable.  No acute  cardiopulmonary process. 12. Chronic respiratory failure with hypoxia: Patient reported that she uses home oxygen only at night.  However here she seems to be hypoxic at times at rest on room air and also hypoxic with activity on room air.  Qualifies for home oxygen.    Consultations:  Neurology  Procedures:  None   Discharge Instructions  Discharge Instructions    Call MD for:   Complete by:  As directed    Recurrent strokelike symptoms.   Call MD for:  extreme fatigue   Complete by:  As directed    Call MD for:  persistant dizziness or light-headedness   Complete by:  As directed    Call MD for:  severe uncontrolled pain   Complete by:  As directed    Diet - low sodium heart healthy   Complete by:  As directed    Diet Carb Modified   Complete by:  As directed    Increase activity slowly   Complete by:  As directed        Medication List    TAKE these medications   aspirin EC 325 MG tablet Take 1 tablet (325 mg total) by mouth daily. Start taking on:  August 24, 2018   atorvastatin 80 MG tablet Commonly known as:  LIPITOR Take 1 tablet (80 mg total) by mouth daily at 6 PM. Start taking on:  August 24, 2018   gabapentin 800 MG tablet Commonly known as:  NEURONTIN Take 800 mg by mouth 3 (three) times daily.   ketorolac 0.5 % ophthalmic solution Commonly known as:  ACULAR Place 1 drop into the left eye 4 (four) times daily. POST-OP   lamoTRIgine 200 MG tablet Commonly known as:  LAMICTAL Take 200 mg by mouth 2 (two) times daily.   moxifloxacin 0.5 % ophthalmic solution Commonly known as:  VIGAMOX Place 1 drop into the left eye 4 (four) times daily. Start 3 hours POST-OP   naproxen sodium 220 MG tablet Commonly known as:  ALEVE Take 220-440 mg by mouth 2 (two) times daily as needed (for pain).   prednisoLONE acetate 1 % ophthalmic suspension Commonly known as:  PRED FORTE Place 1 drop into the left eye 4 (four) times daily. Start POST-OP   ProAir HFA  108 (90 Base) MCG/ACT inhaler Generic drug:  albuterol Inhale 1-2 puffs into the lungs every 6 (six) hours as needed for wheezing or shortness of breath.   Rytary 61.25-245 MG Cpcr Generic drug:  Carbidopa-Levodopa ER Take 1 capsule by mouth 3 (three) times daily.      Follow-up Information    Denese Killings., MD. Schedule an appointment as soon as possible for a visit in 4 week(s).   Specialty:  Neurology Contact information: [address] [place] [address] [phone]        Angelica Chessman, MD. Schedule an appointment as soon as possible for a visit in 1 week(s).   Specialty:  Family Medicine Why:  To be seen with repeat labs (CBC & CMP). Contact information: [address] [place] [address] [phone]          No Known Allergies    Procedures/Studies: Ct Angio Head W Or Wo Contrast  Result Date: 08/23/2018 CLINICAL DATA:  Transient right-sided weakness. EXAM: CT ANGIOGRAPHY HEAD AND NECK TECHNIQUE: Multidetector CT imaging of the head  and neck was performed using the standard protocol during bolus administration of intravenous contrast. Multiplanar CT image reconstructions and MIPs were obtained to evaluate the vascular anatomy. Carotid stenosis measurements (when applicable) are obtained utilizing NASCET criteria, using the distal internal carotid diameter as the denominator. CONTRAST:  50mL ISOVUE-370 IOPAMIDOL (ISOVUE-370) INJECTION 76% COMPARISON:  Head MRI/MRA 08/22/2018 FINDINGS: CTA NECK FINDINGS Aortic arch: Standard 3 vessel aortic arch with moderate atherosclerotic plaque. No significant arch vessel origin stenosis. Right carotid system: Patent with moderate calcified plaque about the carotid bifurcation resulting in less than 50% stenosis of the distal common and proximal internal carotid arteries. Left carotid system: Patent with mild calcified plaque at the carotid bifurcation not resulting in stenosis. Retropharyngeal  course of the proximal ICA. Vertebral arteries: Patent with the left being mildly dominant. Minimal plaque in the proximal right V1 segment without evidence of significant stenosis. Skeleton: Mild cervical spondylosis. Other neck: No mass or enlarged lymph nodes. Upper chest: Mild mosaic attenuation in the lung apices with partially visualized peribronchovascular nodularity more notable in the right upper lobe. These findings were also reported on a 2016 chest CT and likely reflect the patient's history of sarcoidosis. Partially calcified mediastinal and hilar lymphadenopathy is also partially visualized. Review of the MIP images confirms the above findings CTA HEAD FINDINGS Anterior circulation: The internal carotid arteries are patent from skull base to carotid termini with moderate calcified plaque bilaterally. There are mild bilateral cavernous ICA stenoses. ACAs and MCAs are patent without evidence of proximal branch occlusion. The right A1 segment is diminutive. Branch vessel atherosclerotic irregularity is present bilaterally. There is a severe left M2 superior division origin stenosis. There is also a severe mid right A2 stenosis. No aneurysm is identified. Posterior circulation: The intracranial vertebral arteries are patent to the basilar. There is mild left V4 segment calcified atherosclerosis without significant stenosis. Patent PICA, AICA, and SCA origins are visualized bilaterally. The basilar artery is patent with minimal narrowing in its midportion. Posterior communicating arteries are diminutive or absent. The PCAs are patent with moderate diffuse atherosclerotic irregularity bilaterally as well as a severe proximal right P2 stenosis. No aneurysm is identified. Venous sinuses: Patent. Anatomic variants: Hypoplastic right A1. Delayed phase: No abnormal enhancement. Review of the MIP images confirms the above findings IMPRESSION: 1. No large vessel occlusion. 2. Intracranial atherosclerosis resulting  in mild bilateral ICA, severe left M2, severe right A2, and severe right P2 stenoses. 3. Moderate cervical carotid artery atherosclerosis without stenosis. 4. Chronic thoracic lymphadenopathy and lung changes consistent with the history of sarcoidosis. 5.  Aortic Atherosclerosis (ICD10-I70.0). Electronically Signed   By: Sebastian Ache M.D.   On: 08/23/2018 10:49   Ct Angio Neck W Or Wo Contrast  Result Date: 08/23/2018 CLINICAL DATA:  Transient right-sided weakness. EXAM: CT ANGIOGRAPHY HEAD AND NECK TECHNIQUE: Multidetector CT imaging of the head and neck was performed using the standard protocol during bolus administration of intravenous contrast. Multiplanar CT image reconstructions and MIPs were obtained to evaluate the vascular anatomy. Carotid stenosis measurements (when applicable) are obtained utilizing NASCET criteria, using the distal internal carotid diameter as the denominator. CONTRAST:  50mL ISOVUE-370 IOPAMIDOL (ISOVUE-370) INJECTION 76% COMPARISON:  Head MRI/MRA 08/22/2018 FINDINGS: CTA NECK FINDINGS Aortic arch: Standard 3 vessel aortic arch with moderate atherosclerotic plaque. No significant arch vessel origin stenosis. Right carotid system: Patent with moderate calcified plaque about the carotid bifurcation resulting in less than 50% stenosis of the distal common and proximal internal carotid arteries. Left carotid system:  Patent with mild calcified plaque at the carotid bifurcation not resulting in stenosis. Retropharyngeal course of the proximal ICA. Vertebral arteries: Patent with the left being mildly dominant. Minimal plaque in the proximal right V1 segment without evidence of significant stenosis. Skeleton: Mild cervical spondylosis. Other neck: No mass or enlarged lymph nodes. Upper chest: Mild mosaic attenuation in the lung apices with partially visualized peribronchovascular nodularity more notable in the right upper lobe. These findings were also reported on a 2016 chest CT and likely  reflect the patient's history of sarcoidosis. Partially calcified mediastinal and hilar lymphadenopathy is also partially visualized. Review of the MIP images confirms the above findings CTA HEAD FINDINGS Anterior circulation: The internal carotid arteries are patent from skull base to carotid termini with moderate calcified plaque bilaterally. There are mild bilateral cavernous ICA stenoses. ACAs and MCAs are patent without evidence of proximal branch occlusion. The right A1 segment is diminutive. Branch vessel atherosclerotic irregularity is present bilaterally. There is a severe left M2 superior division origin stenosis. There is also a severe mid right A2 stenosis. No aneurysm is identified. Posterior circulation: The intracranial vertebral arteries are patent to the basilar. There is mild left V4 segment calcified atherosclerosis without significant stenosis. Patent PICA, AICA, and SCA origins are visualized bilaterally. The basilar artery is patent with minimal narrowing in its midportion. Posterior communicating arteries are diminutive or absent. The PCAs are patent with moderate diffuse atherosclerotic irregularity bilaterally as well as a severe proximal right P2 stenosis. No aneurysm is identified. Venous sinuses: Patent. Anatomic variants: Hypoplastic right A1. Delayed phase: No abnormal enhancement. Review of the MIP images confirms the above findings IMPRESSION: 1. No large vessel occlusion. 2. Intracranial atherosclerosis resulting in mild bilateral ICA, severe left M2, severe right A2, and severe right P2 stenoses. 3. Moderate cervical carotid artery atherosclerosis without stenosis. 4. Chronic thoracic lymphadenopathy and lung changes consistent with the history of sarcoidosis. 5.  Aortic Atherosclerosis (ICD10-I70.0). Electronically Signed   By: Sebastian Ache M.D.   On: 08/23/2018 10:49   Mr Brain Wo Contrast  Result Date: 08/22/2018 CLINICAL DATA:  Sudden onset of headache and ataxia.  Confusion.  EXAM: MRI HEAD WITHOUT CONTRAST MRA HEAD WITHOUT CONTRAST TECHNIQUE: Multiplanar, multiecho pulse sequences of the brain and surrounding structures were obtained without intravenous contrast. Angiographic images of the head were obtained using MRA technique without contrast. COMPARISON:  None. FINDINGS: MRI HEAD FINDINGS Brain: The diffusion-weighted images demonstrate no evidence for acute or subacute infarction. No acute hemorrhage or mass lesion is present. Remote lacunar infarcts are present in the basal ganglia bilaterally, right greater than left. Additional remote lacunar infarcts are present in the internal capsule bilaterally. A left paramedian remote lacunar infarct is present in the pons. White matter changes extend in the brainstem. The cerebellum is within normal limits. Vascular: Flow is present in the major intracranial arteries. Skull and upper cervical spine: Degenerative changes are present in the upper cervical spine. There is central canal stenosis at C4-5. The craniocervical junction is normal. Sinuses/Orbits: The patient is status post bilateral maxillary antrostomies and ethmoidectomies. Polypoid lesion is noted along the residual inferior right turbinate. Mucosal disease is present along the residual ethmoid air cells. Sphenoid sinuses are clear. Mastoid air cells are clear. The globes and orbits are within normal limits. MRA HEAD FINDINGS Study is mildly degraded by patient motion. Atherosclerotic irregularity is present through the cavernous internal carotid arteries bilaterally without a significant stenosis relative to the more distal vessel. The left A1 segment  is dominant. The right A1 segment is stenotic or aplastic. The anterior communicating artery is patent. There is narrowing of the distal left M1 segment. Right MCA bifurcation is intact. There is moderate attenuation of medium and distal branch vessels of the right MCA. Left MCA branch vessels are intact. There is moderate  narrowing of distal A2 segments bilaterally. The vertebral arteries are codominant. PICA origins are visualized and normal. Basilar artery is normal. Both posterior cerebral arteries originate from the basilar tip. There is narrowing of proximal P2 segments bilaterally, right greater than left. There is significant attenuation of right PCA branch vessels. IMPRESSION: 1. Acute or subacute infarct. 2. Remote lacunar infarcts of the basal ganglia and internal capsule bilaterally, right greater than left. 3. Moderate diffuse white matter disease likely reflects the sequela of chronic microvascular ischemia. 4. Endoscopic nasal surgery some residual mucosal disease through the ethmoid air cells. 5. Narrowing of the distal left M1 segment without significant attenuation of left MCA branch vessels. 6. Right greater than left medium and distal small vessel disease of the MCA branches. 7. Moderate distal A2 segment stenoses. 8. Moderate stenosis of the right PCA with asymmetric attenuation of right PCA branches. Electronically Signed   By: Marin Roberts M.D.   On: 08/22/2018 21:23   Dg Chest Port 1 View  Result Date: 08/23/2018 CLINICAL DATA:  Emphysema.  History of diabetes and hypertension. EXAM: PORTABLE CHEST 1 VIEW COMPARISON:  Radiographs 11/03/2011 and 09/29/2010. A more recent CT performed 05/07/2015 is not available. FINDINGS: 1653 hours. The heart size and mediastinal contours are stable. There is aortic atherosclerosis. The lungs appear stable without focal airspace disease, edema, pleural effusion or pneumothorax. There are degenerative changes throughout the spine. IMPRESSION: Radiographically stable appearance of the chest. No acute cardiopulmonary process. Electronically Signed   By: Carey Bullocks M.D.   On: 08/23/2018 17:15   Mr Maxine Glenn Head Wo Contrast  Result Date: 08/22/2018 CLINICAL DATA:  Sudden onset of headache and ataxia.  Confusion. EXAM: MRI HEAD WITHOUT CONTRAST MRA HEAD WITHOUT CONTRAST  TECHNIQUE: Multiplanar, multiecho pulse sequences of the brain and surrounding structures were obtained without intravenous contrast. Angiographic images of the head were obtained using MRA technique without contrast. COMPARISON:  None. FINDINGS: MRI HEAD FINDINGS Brain: The diffusion-weighted images demonstrate no evidence for acute or subacute infarction. No acute hemorrhage or mass lesion is present. Remote lacunar infarcts are present in the basal ganglia bilaterally, right greater than left. Additional remote lacunar infarcts are present in the internal capsule bilaterally. A left paramedian remote lacunar infarct is present in the pons. White matter changes extend in the brainstem. The cerebellum is within normal limits. Vascular: Flow is present in the major intracranial arteries. Skull and upper cervical spine: Degenerative changes are present in the upper cervical spine. There is central canal stenosis at C4-5. The craniocervical junction is normal. Sinuses/Orbits: The patient is status post bilateral maxillary antrostomies and ethmoidectomies. Polypoid lesion is noted along the residual inferior right turbinate. Mucosal disease is present along the residual ethmoid air cells. Sphenoid sinuses are clear. Mastoid air cells are clear. The globes and orbits are within normal limits. MRA HEAD FINDINGS Study is mildly degraded by patient motion. Atherosclerotic irregularity is present through the cavernous internal carotid arteries bilaterally without a significant stenosis relative to the more distal vessel. The left A1 segment is dominant. The right A1 segment is stenotic or aplastic. The anterior communicating artery is patent. There is narrowing of the distal left M1 segment. Right  MCA bifurcation is intact. There is moderate attenuation of medium and distal branch vessels of the right MCA. Left MCA branch vessels are intact. There is moderate narrowing of distal A2 segments bilaterally. The vertebral  arteries are codominant. PICA origins are visualized and normal. Basilar artery is normal. Both posterior cerebral arteries originate from the basilar tip. There is narrowing of proximal P2 segments bilaterally, right greater than left. There is significant attenuation of right PCA branch vessels. IMPRESSION: 1. Acute or subacute infarct. 2. Remote lacunar infarcts of the basal ganglia and internal capsule bilaterally, right greater than left. 3. Moderate diffuse white matter disease likely reflects the sequela of chronic microvascular ischemia. 4. Endoscopic nasal surgery some residual mucosal disease through the ethmoid air cells. 5. Narrowing of the distal left M1 segment without significant attenuation of left MCA branch vessels. 6. Right greater than left medium and distal small vessel disease of the MCA branches. 7. Moderate distal A2 segment stenoses. 8. Moderate stenosis of the right PCA with asymmetric attenuation of right PCA branches. Electronically Signed   By: Marin Roberts M.D.   On: 08/22/2018 21:23   Ct Head Code Stroke Wo Contrast  Result Date: 08/22/2018 CLINICAL DATA:  Code stroke. Sudden onset headache, ataxia, and confusion. EXAM: CT HEAD WITHOUT CONTRAST TECHNIQUE: Contiguous axial images were obtained from the base of the skull through the vertex without intravenous contrast. COMPARISON:  None available. FINDINGS: Brain: No evidence of acute infarction, hemorrhage, hydrocephalus, extra-axial collection or mass lesion/mass effect. Remote appearing lacunar infarct in the right basal ganglia. Mild cerebral volume loss. Vascular: No hyperdense vessel. There is atherosclerotic calcification. Skull: No acute or aggressive finding Sinuses/Orbits: Postoperative paranasal sinuses with mild mucosal thickening. Other: These results were called by telephone at the time of interpretation on 08/22/2018 at 1:47 pm to Dr. Alona Bene , who verbally acknowledged these results. ASPECTS [facility] Stroke  Program Early CT Score) Not scored with this symptomatology IMPRESSION: 1. No acute finding. 2. Remote appearing right basal ganglia infarct. Electronically Signed   By: Marnee Spring M.D.   On: 08/22/2018 13:48      Subjective: Patient has no recurrence of headache, dizziness or right-sided weakness.  Denies any other complaints.  Denies dyspnea both at rest or with activity.  Cannot recollect how much oxygen she is on at home.  States that she has an oxygen machine at home.  Discharge Exam:  Vitals:   08/23/18 0830 08/23/18 1154 08/23/18 1554 08/23/18 1945  BP: (!) 150/75 136/80 135/75 134/71  Pulse: 72   81  Resp:    16  Temp: 98.6 F (37 C) 98.6 F (37 C)  98.5 F (36.9 C)  TempSrc:    Oral  SpO2: 100% 95% 92% 94%  Weight:      Height:        General: Pleasant middle-aged female, moderately built and obese lying comfortably propped up in bed. Cardiovascular: S1 & S2 heard, RRR, S1/S2 +. No murmurs, rubs, gallops or clicks. No JVD or pedal edema.  Telemetry personally reviewed: Sinus rhythm. Respiratory: Clear to auscultation without wheezing, rhonchi or crackles. No increased work of breathing. Abdominal:  Non distended, non tender & soft. No organomegaly or masses appreciated. Normal bowel sounds heard. CNS: Alert and oriented. No focal deficits.  Flat affect. Extremities: no edema, no cyanosis.  Symmetric 5 x 5 power.    The results of significant diagnostics from this hospitalization (including imaging, microbiology, ancillary and laboratory) are listed below for reference.  Labs: CBC: Recent Labs  Lab 08/22/18 1344 08/23/18 0740  WBC 3.4* 2.5*  NEUTROABS 2.4  --   HGB 13.3 11.7*  HCT 42.8 38.5  MCV 92.8 92.3  PLT 143* 121*   Basic Metabolic Panel: Recent Labs  Lab 08/22/18 1344 08/23/18 0740  NA 137 138  K 4.4 4.5  CL 97* 101  CO2 31 31  GLUCOSE 127* 122*  BUN 22 21  CREATININE 1.41* 1.13*  CALCIUM 9.2 8.5*   Liver Function Tests: Recent  Labs  Lab 08/22/18 1344  AST 18  ALT <5  ALKPHOS 90  BILITOT 0.7  PROT 7.5  ALBUMIN 4.2   CBG: Recent Labs  Lab 08/22/18 1801 08/22/18 2245 08/23/18 0810 08/23/18 1310 08/23/18 1821  GLUCAP 67* 184* 122* 147* 116*   Hgb A1c Recent Labs    08/23/18 0529  HGBA1C 6.7*   Lipid Profile Recent Labs    08/23/18 0529  CHOL 236*  HDL 45  LDLCALC 167*  TRIG 120  CHOLHDL 5.2   Discussed in detail with patient's spouse and daughter.  Updated care and answered all questions.   Time coordinating discharge: 40 minutes  SIGNED:  Marcellus Scott, MD, FACP, [facility]. [facility]  To contact the attending provider between 7A-7P or the covering provider during after hours 7P-7A, please log into the web site [email or web address] and access using universal [place] password for that web site. If you do not have the password, please call the hospital operator.

## 2018-08-23 NOTE — Evaluation (Signed)
Physical Therapy Evaluation Patient Details Name: Sara Raymond MRN: [id number] DOB: August 11, 1950 Today's Date: 08/23/2018   History of Present Illness  Patient is a 68 y/o female who presents with dizziness, HA and RUE weakness while at church. Admitted for likely TIA vs possible seizure. EEG pending. Brain MRI-acute vs subacute lacunar infarct in basal ganglia and internal capsule bilaterally Rt>left. PMH includes seizures, HTN, DM, sarcoidosis.   Clinical Impression  Patient presents with dizziness, generalized weakness, impaired balance and impaired mobility s/p above. Pt Mod I PTA, using SPC as needed and lives with spouse. Tolerated transfers and gait training with Min guard-Min A for balance/safety. May require use of RW for support for safety to decrease fall risk. Sp02 dropped to 85% on RA in standing; required 2L/min 02 for mobility and Sp02 ranged from 88-93%. Reports only wearing 02 at night but does not know how many liters. Will follow acutely to maximize independence and mobility prior to return home.     Follow Up Recommendations Home health PT;Supervision for mobility/OOB    Equipment Recommendations  Rolling walker with 5" wheels(TBA)    Recommendations for Other Services       Precautions / Restrictions Precautions Precautions: Fall Precaution Comments: dizziness Restrictions Weight Bearing Restrictions: No      Mobility  Bed Mobility Overal bed mobility: Needs Assistance Bed Mobility: Supine to Sit     Supine to sit: Supervision;HOB elevated     General bed mobility comments: Supervision for safety. Pt not using RUE for transfer (question due to lines all on that side?)  Transfers Overall transfer level: Needs assistance Equipment used: None Transfers: Sit to/from Stand Sit to Stand: Min guard         General transfer comment: Min guard for safety. + dizziness. Performed x2. BP stable.   Ambulation/Gait Ambulation/Gait assistance: Min assist Gait  Distance (Feet): 100 Feet Assistive device: (rail for support) Gait Pattern/deviations: Step-through pattern Gait velocity: decreased   General Gait Details: Slow, guarded gait holding onto rail for support at times; MIn A for balance. Sp02 dropped to 85% on RA, donned 2L/min 02 and ranged from 88-93%.   Stairs            Wheelchair Mobility    Modified Rankin (Stroke Patients Only) Modified Rankin (Stroke Patients Only) Pre-Morbid Rankin Score: Slight disability Modified Rankin: Moderately severe disability     Balance Overall balance assessment: Needs assistance Sitting-balance support: Feet supported;No upper extremity supported Sitting balance-Leahy Scale: Good     Standing balance support: During functional activity Standing balance-Leahy Scale: Fair Standing balance comment: Statically Min guard assist for safety due to dizziness but MIn A needed for dynamic tasks.                              Pertinent Vitals/Pain Pain Assessment: No/denies pain    Home Living Family/patient expects to be discharged to:: Private residence Living Arrangements: Spouse/significant other Available Help at Discharge: Family;Available 24 hours/day Type of Home: House Home Access: Stairs to enter Entrance Stairs-Rails: Right Entrance Stairs-Number of Steps: 2-3 Home Layout: One level;Laundry or work area in [employer] Equipment: Gil[place] Mor - single point      Prior Function Level of Independence: Independent         Comments: Does own ADLs, cooks, cleans and drives. 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 Reports "not sure" to some questions. Question if this is baseline? Conversation appropriate.      General Comments      Exercises     Assessment/Plan    PT Assessment Patient needs continued PT services  PT Problem List Decreased strength;Decreased balance;Cardiopulmonary status limiting activity;Decreased mobility;Decreased activity tolerance;Decreased cognition       PT Treatment Interventions Functional mobility training;Balance training;Patient/family education;Gait training;Therapeutic activities;Therapeutic exercise;Stair training;Cognitive remediation;Neuromuscular re-education;DME instruction    PT Goals (Current goals can be found in the Care Plan section)  Acute Rehab PT Goals Patient Stated Goal: to go home PT Goal Formulation: With patient Time For Goal Achievement: 09/06/18 Potential to Achieve Goals: Good    Frequency Min 4X/week   Barriers to discharge        Co-evaluation               AM-PAC PT "6 Clicks" Mobility  Outcome Measure Help needed turning from your back to your side while in a flat bed without using bedrails?: A Little Help needed moving from lying on your back to sitting on the side of a flat bed without using bedrails?: A Little Help needed moving to and from a bed to a chair (including a wheelchair)?: A Little Help needed standing up from a chair using your arms (e.g., wheelchair or bedside chair)?: None Help needed to walk in hospital room?: A Little Help needed climbing 3-5 steps with a railing? : A Little 6 Click Score: 19    End of Session Equipment Utilized During Treatment: Gait belt Activity Tolerance: Patient tolerated treatment well Patient left: in chair;with  call bell/phone within reach;with chair alarm set;with nursing/sitter in room(chair alarm pad under pt but no box) Nurse Communication: Mobility status PT Visit Diagnosis: Unsteadiness on feet (R26.81);Muscle weakness (generalized) (M62.81);Dizziness and giddiness (R42)    Time: 0301-3143 PT Time Calculation (min) (ACUTE ONLY): 25 min   Charges:   PT Evaluation $PT Eval Moderate Complexity: 1 Mod PT Treatments $Gait Training: 8-22 mins        Mylo Red, PT, DPT Acute Rehabilitation Services Pager [phone] Office [phone]      Blake Divine A Lanier Ensign 08/23/2018, 8:44 AM

## 2018-08-23 NOTE — Progress Notes (Signed)
OT Cancellation Note  Patient Details Name: Sara Raymond MRN: [id number] DOB: 07-24-1950   Cancelled Treatment:    Reason Eval/Treat Not Completed: Patient at procedure or test/ unavailable. Per RN transported to CT. Will follow and initiate OT eval when able.   Chancy Milroy, OT Acute Rehabilitation Services Pager [phone] Office [phone]    Chancy Milroy 08/23/2018, 9:16 AM

## 2018-08-24 NOTE — Progress Notes (Signed)
Pt discharged home late yesterday. She was on home oxygen at night and prn through [facility]. CM called and notified them of change in oxygen requirements and new orders.  CM called pt this am and she asked that daughter be called about DME. CM called daughter: Lamar Laundry  [phone] and she will pick up the DME from [employer] in [facility]. CM will fax them the orders.  CM also inquired about [facility] services. CM provided choice to [place] selected. Cory with [facility] notified and accepted the referral. They are to call [facility] for the appts. And number provided.

## 2019-05-26 ENCOUNTER — Encounter (HOSPITAL_BASED_OUTPATIENT_CLINIC_OR_DEPARTMENT_OTHER): Payer: Self-pay | Admitting: Emergency Medicine

## 2019-05-26 ENCOUNTER — Emergency Department (HOSPITAL_BASED_OUTPATIENT_CLINIC_OR_DEPARTMENT_OTHER)
Admission: EM | Admit: 2019-05-26 | Discharge: 2019-05-26 | Discharge status: Against Medical Advice | Payer: Medicare Other | Attending: Emergency Medicine | Admitting: Emergency Medicine

## 2019-05-26 ENCOUNTER — Emergency Department (HOSPITAL_BASED_OUTPATIENT_CLINIC_OR_DEPARTMENT_OTHER): Payer: Medicare Other

## 2019-05-26 ENCOUNTER — Other Ambulatory Visit: Payer: Self-pay

## 2019-05-26 ENCOUNTER — Emergency Department (HOSPITAL_COMMUNITY): Payer: Medicare Other

## 2019-05-26 DIAGNOSIS — E119 Type 2 diabetes mellitus without complications: Secondary | ICD-10-CM | POA: Insufficient documentation

## 2019-05-26 DIAGNOSIS — R531 Weakness: Secondary | ICD-10-CM | POA: Diagnosis not present

## 2019-05-26 DIAGNOSIS — R0602 Shortness of breath: Secondary | ICD-10-CM | POA: Diagnosis not present

## 2019-05-26 DIAGNOSIS — G40909 Epilepsy, unspecified, not intractable, without status epilepticus: Secondary | ICD-10-CM | POA: Diagnosis not present

## 2019-05-26 DIAGNOSIS — G20 Parkinson's disease: Secondary | ICD-10-CM | POA: Diagnosis not present

## 2019-05-26 DIAGNOSIS — N183 Chronic kidney disease, stage 3 unspecified: Secondary | ICD-10-CM | POA: Insufficient documentation

## 2019-05-26 DIAGNOSIS — Z20828 Contact with and (suspected) exposure to other viral communicable diseases: Secondary | ICD-10-CM | POA: Diagnosis not present

## 2019-05-26 DIAGNOSIS — I129 Hypertensive chronic kidney disease with stage 1 through stage 4 chronic kidney disease, or unspecified chronic kidney disease: Secondary | ICD-10-CM | POA: Insufficient documentation

## 2019-05-26 DIAGNOSIS — R062 Wheezing: Secondary | ICD-10-CM | POA: Diagnosis present

## 2019-05-26 DIAGNOSIS — R0902 Hypoxemia: Secondary | ICD-10-CM | POA: Insufficient documentation

## 2019-05-26 LAB — COMPREHENSIVE METABOLIC PANEL
ALT: 9 U/L (ref 0–44)
AST: 17 U/L (ref 15–41)
Albumin: 4.2 g/dL (ref 3.5–5.0)
Alkaline Phosphatase: 74 U/L (ref 38–126)
Anion gap: 10 (ref 5–15)
BUN: 22 mg/dL (ref 8–23)
CO2: 34 mmol/L — ABNORMAL HIGH (ref 22–32)
Calcium: 9.7 mg/dL (ref 8.9–10.3)
Chloride: 94 mmol/L — ABNORMAL LOW (ref 98–111)
Creatinine, Ser: 1.17 mg/dL — ABNORMAL HIGH (ref 0.44–1.00)
GFR calc Af Amer: 55 mL/min — ABNORMAL LOW (ref >60)
GFR calc non Af Amer: 47 mL/min — ABNORMAL LOW (ref >60)
Glucose, Bld: 99 mg/dL (ref 70–99)
Potassium: 4.3 mmol/L (ref 3.5–5.1)
Sodium: 138 mmol/L (ref 135–145)
Total Bilirubin: 0.8 mg/dL (ref 0.3–1.2)
Total Protein: 7.2 g/dL (ref 6.5–8.1)

## 2019-05-26 LAB — TRIGLYCERIDES: Triglycerides: 121 mg/dL (ref <150)

## 2019-05-26 LAB — CBC WITH DIFFERENTIAL/PLATELET
Abs Immature Granulocytes: 0.01 10*3/uL (ref 0.00–0.07)
Basophils Absolute: 0 10*3/uL (ref 0.0–0.1)
Basophils Relative: 1 %
Eosinophils Absolute: 0.2 10*3/uL (ref 0.0–0.5)
Eosinophils Relative: 10 %
HCT: 42.1 % (ref 36.0–46.0)
Hemoglobin: 12.9 g/dL (ref 12.0–15.0)
Immature Granulocytes: 0 %
Lymphocytes Relative: 17 %
Lymphs Abs: 0.4 10*3/uL — ABNORMAL LOW (ref 0.7–4.0)
MCH: 29.7 pg (ref 26.0–34.0)
MCHC: 30.6 g/dL (ref 30.0–36.0)
MCV: 96.8 fL (ref 80.0–100.0)
Monocytes Absolute: 0.3 10*3/uL (ref 0.1–1.0)
Monocytes Relative: 13 %
Neutro Abs: 1.4 10*3/uL — ABNORMAL LOW (ref 1.7–7.7)
Neutrophils Relative %: 59 %
Platelets: 148 10*3/uL — ABNORMAL LOW (ref 150–400)
RBC: 4.35 MIL/uL (ref 3.87–5.11)
RDW: 14 % (ref 11.5–15.5)
WBC: 2.4 10*3/uL — ABNORMAL LOW (ref 4.0–10.5)
nRBC: 0 % (ref 0.0–0.2)

## 2019-05-26 LAB — FERRITIN: Ferritin: 70 ng/mL (ref 11–307)

## 2019-05-26 LAB — SARS CORONAVIRUS 2 AG (30 MIN TAT): SARS Coronavirus 2 Ag: NEGATIVE

## 2019-05-26 LAB — PROCALCITONIN: Procalcitonin: <0.1 ng/mL

## 2019-05-26 LAB — D-DIMER, QUANTITATIVE: D-Dimer, Quant: 1.67 ug/mL-FEU — ABNORMAL HIGH (ref 0.00–0.50)

## 2019-05-26 LAB — LACTATE DEHYDROGENASE: LDH: 174 U/L (ref 98–192)

## 2019-05-26 LAB — LACTIC ACID, PLASMA: Lactic Acid, Venous: 1.3 mmol/L (ref 0.5–1.9)

## 2019-05-26 LAB — C-REACTIVE PROTEIN: CRP: 0.5 mg/dL (ref <1.0)

## 2019-05-26 LAB — FIBRINOGEN: Fibrinogen: 403 mg/dL (ref 210–475)

## 2019-05-26 MED ORDER — IOHEXOL 350 MG/ML SOLN
100.0000 mL | Freq: Once | INTRAVENOUS | Status: AC | PRN
  Administered 2019-05-26: 100 mL via INTRAVENOUS

## 2019-05-26 MED ORDER — DEXAMETHASONE SODIUM PHOSPHATE 10 MG/ML IJ SOLN
10.0000 mg | Freq: Once | INTRAMUSCULAR | Status: AC
  Administered 2019-05-26: 10 mg via INTRAVENOUS
  Filled 2019-05-26: qty 1

## 2019-05-26 NOTE — ED Triage Notes (Signed)
Pt states she has been having some wheezing and one episode of shortness of breath  Pt states her daughter is covid positive  Pt states she has not felt well for a couple of weeks  Pt states she was just at urgent care in [place] and they sent her here, told her she had fluid on her lungs

## 2019-05-26 NOTE — ED Provider Notes (Signed)
Emergency Department Provider Note   I have reviewed the triage vital signs and the nursing notes.   HISTORY  Chief Complaint Wheezing   HPI Sara Raymond is a 67 y.o. female with PMH of sarcoidosis, HTN, and DM presents to the emergency department for evaluation of generalized weakness, trouble breathing, subjective wheezing.  The patient has been around her daughter multiple times who has recently tested positive for COVID-19.  She initially presented to the urgent care in [place] but was referred here with hypoxemia.  She was told after an x-ray there that she may have fluid on her lungs.  She has had subjective fever. Denies chills.  She has had some diarrhea type illness but denies vomiting.  She has had some mild nausea.  Note specific chest pain but occasionally feels some intermittent pressure sensation with SOB episodes.   Past Medical History:  Diagnosis Date  . Diabetes mellitus without complication ([place])   . Hypertension   . Sarcoidosis   . Seizures [facility])     Patient Active Problem List   Diagnosis Date Noted  . Acute CVA (cerebrovascular accident) ([place]) 08/23/2018  . TIA (transient ischemic attack) 08/22/2018  . Seizures ([place]) 08/22/2018  . Hypertension 08/22/2018  . 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 CKD (chronic kidney disease), stage III 08/22/2018  . Parkinson disease ([place]) 08/22/2018    Past Surgical History:  Procedure Laterality Date  . ABDOMINAL HYSTERECTOMY      Allergies Patient has no known allergies.  Family History  Problem Relation Age of Onset  . Hypertension Other     Social History Social History   Tobacco Use  . Smoking status: Never Smoker  . Smokeless tobacco: Never Used  Substance Use Topics  . Alcohol use: No  . Drug use: No    Review of Systems  Constitutional: Positive fever and generalized weakness.  Eyes: No visual changes. ENT: Mild sore  throat. Cardiovascular: Denies chest pain. Respiratory: Positive shortness of breath and cough.  Gastrointestinal: No abdominal pain. Positive nausea, no vomiting. Mild diarrhea.  No constipation. Genitourinary: Negative for dysuria. Musculoskeletal: Negative for back pain. Skin: Negative for rash. Neurological: Negative for focal weakness or numbness. Positive HA.   10-point ROS otherwise negative.  ____________________________________________   PHYSICAL EXAM:  VITAL SIGNS: ED Triage Vitals  Enc Vitals Group     BP 05/26/19 1933 (!) 176/107     Pulse Rate 05/26/19 1933 (!) 105     Resp 05/26/19 1933 16     Temp 05/26/19 1933 98.3 F (36.8 C)     Temp Source 05/26/19 1933 Oral     SpO2 05/26/19 1933 (!) 80 %     Weight 05/26/19 1935 183 lb (83 kg)     Height 05/26/19 1935 5\' 3"  (1.6 m)   Constitutional: Alert and oriented. Well appearing and in no acute distress. Eyes: Conjunctivae are normal.  Head: Atraumatic. Nose: No congestion/rhinnorhea. Mouth/Throat: Mucous membranes are moist.   Neck: No stridor.   Cardiovascular: Tachycardia. Good peripheral circulation. Grossly normal heart sounds.   Respiratory: Slight increased respiratory effort.  No retractions. Lungs CTAB. Gastrointestinal: Soft and nontender. No distention.  Musculoskeletal: No lower extremity tenderness nor edema. No gross deformities of extremities. Neurologic:  Normal speech and language.  Skin:  Skin is warm, dry and intact. No rash noted.  ____________________________________________   LABS (all labs ordered are listed, but only abnormal results are displayed)  Labs Reviewed  CBC WITH DIFFERENTIAL/PLATELET - Abnormal; Notable for the following components:      Result Value   WBC 2.4 (*)    Platelets 148 (*)    Neutro Abs 1.4 (*)    Lymphs Abs 0.4 (*)    All other components within normal limits  COMPREHENSIVE METABOLIC PANEL - Abnormal; Notable for the following components:   Chloride 94  (*)    CO2 34 (*)    Creatinine, Ser 1.17 (*)    GFR calc non Af Amer 47 (*)    GFR calc Af Amer 55 (*)    All other components within normal limits  D-DIMER, QUANTITATIVE (NOT AT [facility]) - Abnormal; Notable for the following components:   D-Dimer, Quant 1.67 (*)    All other components within normal limits  SARS CORONAVIRUS 2 AG (30 MIN TAT)  CULTURE, BLOOD (ROUTINE X 2)  CULTURE, BLOOD (ROUTINE X 2)  NOVEL CORONAVIRUS, NAA (HOSP ORDER, SEND-OUT TO REF LAB; TAT 18-24 HRS)  LACTIC ACID, PLASMA  PROCALCITONIN  LACTATE DEHYDROGENASE  FERRITIN  TRIGLYCERIDES  FIBRINOGEN  C-REACTIVE PROTEIN   ____________________________________________  EKG   EKG Interpretation  Date/Time:  Thursday May 26 2019 19:55:41 EST Ventricular Rate:  83 PR Interval:    QRS Duration: 75 QT Interval:  364 QTC Calculation: 428 R Axis:   71 Text Interpretation: Sinus rhythm Right atrial enlargement Baseline wander in lead(s) V6 No STEMI Confirmed by Alona Bene [phone]) on 05/26/2019 8:38:53 PM       ____________________________________________  RADIOLOGY  CT Angio Chest PE W and/or Wo Contrast  Result Date: 05/26/2019 CLINICAL DATA:  Wheezing and shortness of breath, recent COVID exposure, history of sarcoidosis EXAM: CT ANGIOGRAPHY CHEST WITH CONTRAST TECHNIQUE: Multidetector CT imaging of the chest was performed using the standard protocol during bolus administration of intravenous contrast. Multiplanar CT image reconstructions and MIPs were obtained to evaluate the vascular anatomy. CONTRAST:  OMNIPAQUE IOHEXOL 350 MG/ML SOLN COMPARISON:  CTA chest 09/24/2009 (report only) CT chest 05/07/2015 FINDINGS: Cardiovascular: Satisfactory opacification the pulmonary arteries to the segmental level. No pulmonary artery filling defects are identified. Central pulmonary arteries are normal caliber. No elevation of the RV/LV ratio (0.7). Normal cardiac size. Scant atherosclerotic plaque coronary  arteries. Calcifications upon the aortic valve leaflets. Thoracic aorta is normal caliber with moderate atherosclerotic plaque in normal 3 vessel branching of the great vessels. Great vessels themselves are mildly calcified. Mediastinum/Nodes: There is bulky partially calcified mediastinal and hilar adenopathy compatible with patient's provided history of sarcoidosis largest noted deposit is a subcarinal lymph node measuring up to 2.2 cm, not significantly changed from comparison CT in November of 2016. Furthermore, bulky adenopathy in the hilar regions likely corresponds to the prominence of the central pulmonary artery silhouette postulated today's portable frontal only radiograph. No acute abnormality of the trachea or esophagus. Thyroid gland and thoracic inlet are unremarkable. Lungs/Pleura: There is diffuse mosaic attenuation in the lungs suggestive of air trapping/small airways disease which is likely accentuated by imaging during exhalation is evidence by posterior bowing of the trachea. A few scattered areas of focal scarring and architectural distortion with cylindrical bronchiectasis are seen bilaterally, not significantly changed in appearance from comparison CT in 2016. Partially calcified nodule is seen right middle lobe along the minor fissure, also unchanged from prior. No acute consolidative process. No pneumothorax or visible effusion. Upper Abdomen: No acute abnormalities present in the visualized portions of the upper abdomen. Musculoskeletal: Multilevel degenerative changes are present in the imaged portions  of the spine. No acute chest wall abnormality. No worrisome or suspicious osseous lesions. Review of the MIP images confirms the above findings. IMPRESSION: 1. Satisfactory opacification the pulmonary arteries. No evidence of pulmonary artery embolism. 2. Bulky partially calcified mediastinal and hilar adenopathy compatible with patient's provided history of sarcoidosis and likely  corresponding to the hilar prominence on today's radiography. 3. Stable regions of scarring and architectural distortion with bronchiectasis in the lungs. 4. Mosaic attenuation in the lungs suggestive of air trapping/small airways disease which is likely accentuated by imaging during exhalation. 5. No acute intrathoracic process. 6. Aortic Atherosclerosis (ICD10-I70.0). Electronically Signed   By: Kreg Shropshire M.D.   On: 05/26/2019 22:29   DG Chest Port 1 View  Result Date: 05/26/2019 CLINICAL DATA:  Shortness of breath and hypoxemia. Wheezing. Exposure to coronavirus. EXAM: PORTABLE CHEST 1 VIEW COMPARISON:  08/23/2018 FINDINGS: Heart size is normal. Aortic atherosclerosis. Chronic bilateral hilar prominence. No evidence of pulmonary infiltrate, collapse or effusion. No significant bone finding. IMPRESSION: No active disease. Aortic atherosclerosis. Chronic bilateral hilar prominence consistent with the diagnosis of sarcoid. Electronically Signed   By: Paulina Fusi M.D.   On: 05/26/2019 20:18    ____________________________________________   PROCEDURES  Procedure(s) performed:   Procedures  CRITICAL CARE Performed by: Maia Plan Total critical care time: 35 minutes Critical care time was exclusive of separately billable procedures and treating other patients. Critical care was necessary to treat or prevent imminent or life-threatening deterioration. Critical care was time spent personally by me on the following activities: development of treatment plan with patient and/or surrogate as well as nursing, discussions with consultants, evaluation of patient's response to treatment, examination of patient, obtaining history from patient or surrogate, ordering and performing treatments and interventions, ordering and review of laboratory studies, ordering and review of radiographic studies, pulse oximetry and re-evaluation of patient's condition.  Alona Bene, MD Emergency  Medicine  ____________________________________________   INITIAL IMPRESSION / ASSESSMENT AND PLAN / ED COURSE  Pertinent labs & imaging results that were available during my care of the patient were reviewed by me and considered in my medical decision making (see chart for details).   Patient presents to the ED with COVID like symptoms and close contact positive for the virus. Patient hypoxic on arrival and improved with [place] O2. Patient does wear O2 at home but just at night. Mild increased WOB. Labs and imaging reviewed. Rapid COVID is negative. Sending PCR. CTA reviewed and discussed with patient. Advised admit with new O2 requirement.   11:19 PM  Patient's rapid Covid test is negative.  Plan for PCR test.  With elevated D-dimer patient sent for CTA which does not show PE.  Question sarcoid type flare vs COVID PNA. No clear Covid pattern or multifocal pneumonia.  Patient remains on 3 L.  She appears somewhat short of breath on my reevaluation.  I encouraged her to be admitted for steroid, oxygen, further work-up for her hypoxemia and shortness of breath.  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  She is choosing to leave AGAINST MEDICAL ADVICE at this time.  We had a Lakelyn Straus discussion regarding my concern for possibly getting worse leading to significant morbidity and possibly mortality.  Patient has full capacity to make this decision on my assessment.  She is able to have a meaningful discussion regarding the risks and is willing to accept them.  I encouraged her to return to  the emergency department immediately should she change her mind or should her symptoms worsen and that we would gladly welcome her back here or to any other emergency department. Patient verbalizes understanding.    ____________________________________________  FINAL CLINICAL IMPRESSION(S) / ED DIAGNOSES  Final diagnoses:  Hypoxemia  SOB (shortness of breath)   Generalized weakness     MEDICATIONS GIVEN DURING THIS VISIT:  Medications  iohexol (OMNIPAQUE) 350 MG/ML injection 100 mL (100 mLs Intravenous Contrast Given 05/26/19 2158)  dexamethasone (DECADRON) injection 10 mg (10 mg Intravenous Given 05/26/19 2346)    Note:  This document was prepared using Dragon voice recognition software and may include unintentional dictation errors.  Alona BeneJoshua Ha Placeres, MD, [facility] Emergency Medicine    Haruye Lainez, Arlyss RepressJoshua G, MD 05/27/19 1041

## 2019-05-26 NOTE — Discharge Instructions (Signed)
You were seen in the emergency department today with low oxygen levels.  I do have concern for possible COVID-19 and I am sending an additional test.  Please stay isolated from others and wear your home oxygen at all times.  I was concerned about your presentation here today and feel that you would benefit from staying in the hospital.  You are deciding to leave [place] but please note that you are free and encouraged to return to the emergency department at any time should you change your mind but especially if you feel worse.  Please call 911 with severe symptoms.  I did give you steroid here which should help over the next several days.  Please call your primary care doctor first thing tomorrow to schedule the next available follow-up appointment.

## 2019-05-28 LAB — NOVEL CORONAVIRUS, NAA (HOSP ORDER, SEND-OUT TO REF LAB; TAT 18-24 HRS): SARS-CoV-2, NAA: NOT DETECTED

## 2019-05-31 LAB — CULTURE, BLOOD (ROUTINE X 2)
Culture: NO GROWTH
Culture: NO GROWTH
Special Requests: ADEQUATE
Special Requests: ADEQUATE

## 2019-07-18 DIAGNOSIS — R911 Solitary pulmonary nodule: Secondary | ICD-10-CM | POA: Insufficient documentation

## 2019-11-01 HISTORY — PX: CATARACT EXTRACTION W/ INTRAOCULAR LENS IMPLANT: SHX1309

## 2020-01-22 ENCOUNTER — Emergency Department (HOSPITAL_COMMUNITY): Payer: Medicare Other

## 2020-01-22 ENCOUNTER — Inpatient Hospital Stay (HOSPITAL_COMMUNITY)
Admission: EM | Admit: 2020-01-22 | Discharge: 2020-01-26 | DRG: 091 | Disposition: A | Discharge status: Skilled Nursing Facility | Payer: Medicare Other | Attending: Family Medicine | Admitting: Family Medicine

## 2020-01-22 ENCOUNTER — Encounter (HOSPITAL_COMMUNITY): Payer: Self-pay | Admitting: Emergency Medicine

## 2020-01-22 DIAGNOSIS — Z79899 Other long term (current) drug therapy: Secondary | ICD-10-CM

## 2020-01-22 DIAGNOSIS — Y92009 Unspecified place in unspecified non-institutional (private) residence as the place of occurrence of the external cause: Secondary | ICD-10-CM

## 2020-01-22 DIAGNOSIS — G20 Parkinson's disease: Secondary | ICD-10-CM | POA: Diagnosis present

## 2020-01-22 DIAGNOSIS — I129 Hypertensive chronic kidney disease with stage 1 through stage 4 chronic kidney disease, or unspecified chronic kidney disease: Secondary | ICD-10-CM | POA: Diagnosis present

## 2020-01-22 DIAGNOSIS — J449 Chronic obstructive pulmonary disease, unspecified: Secondary | ICD-10-CM | POA: Diagnosis present

## 2020-01-22 DIAGNOSIS — W19XXXA Unspecified fall, initial encounter: Secondary | ICD-10-CM

## 2020-01-22 DIAGNOSIS — R278 Other lack of coordination: Secondary | ICD-10-CM | POA: Diagnosis present

## 2020-01-22 DIAGNOSIS — E1122 Type 2 diabetes mellitus with diabetic chronic kidney disease: Secondary | ICD-10-CM | POA: Diagnosis present

## 2020-01-22 DIAGNOSIS — G928 Other toxic encephalopathy: Secondary | ICD-10-CM | POA: Diagnosis present

## 2020-01-22 DIAGNOSIS — N183 Chronic kidney disease, stage 3 unspecified: Secondary | ICD-10-CM | POA: Diagnosis present

## 2020-01-22 DIAGNOSIS — G250 Essential tremor: Secondary | ICD-10-CM | POA: Diagnosis present

## 2020-01-22 DIAGNOSIS — R471 Dysarthria and anarthria: Secondary | ICD-10-CM

## 2020-01-22 DIAGNOSIS — D869 Sarcoidosis, unspecified: Secondary | ICD-10-CM | POA: Diagnosis present

## 2020-01-22 DIAGNOSIS — G92 Toxic encephalopathy: Secondary | ICD-10-CM | POA: Diagnosis not present

## 2020-01-22 DIAGNOSIS — Z7982 Long term (current) use of aspirin: Secondary | ICD-10-CM

## 2020-01-22 DIAGNOSIS — R531 Weakness: Secondary | ICD-10-CM | POA: Diagnosis present

## 2020-01-22 DIAGNOSIS — G9341 Metabolic encephalopathy: Secondary | ICD-10-CM | POA: Diagnosis present

## 2020-01-22 DIAGNOSIS — R270 Ataxia, unspecified: Secondary | ICD-10-CM | POA: Diagnosis not present

## 2020-01-22 DIAGNOSIS — Z8249 Family history of ischemic heart disease and other diseases of the circulatory system: Secondary | ICD-10-CM

## 2020-01-22 DIAGNOSIS — Z9981 Dependence on supplemental oxygen: Secondary | ICD-10-CM

## 2020-01-22 DIAGNOSIS — I10 Essential (primary) hypertension: Secondary | ICD-10-CM | POA: Diagnosis present

## 2020-01-22 DIAGNOSIS — R479 Unspecified speech disturbances: Secondary | ICD-10-CM

## 2020-01-22 DIAGNOSIS — T426X5A Adverse effect of other antiepileptic and sedative-hypnotic drugs, initial encounter: Secondary | ICD-10-CM | POA: Diagnosis present

## 2020-01-22 DIAGNOSIS — Z20822 Contact with and (suspected) exposure to covid-19: Secondary | ICD-10-CM | POA: Diagnosis present

## 2020-01-22 DIAGNOSIS — R569 Unspecified convulsions: Secondary | ICD-10-CM | POA: Diagnosis present

## 2020-01-22 DIAGNOSIS — R296 Repeated falls: Secondary | ICD-10-CM | POA: Diagnosis present

## 2020-01-22 HISTORY — DX: Parkinson's disease: G20

## 2020-01-22 HISTORY — DX: Unspecified asthma, uncomplicated: J45.909

## 2020-01-22 HISTORY — DX: Parkinson's disease without dyskinesia, without mention of fluctuations: G20.A1

## 2020-01-22 LAB — COMPREHENSIVE METABOLIC PANEL
ALT: 6 U/L (ref 0–44)
AST: 21 U/L (ref 15–41)
Albumin: 3.6 g/dL (ref 3.5–5.0)
Alkaline Phosphatase: 79 U/L (ref 38–126)
Anion gap: 9 (ref 5–15)
BUN: 22 mg/dL (ref 8–23)
CO2: 31 mmol/L (ref 22–32)
Calcium: 9 mg/dL (ref 8.9–10.3)
Chloride: 96 mmol/L — ABNORMAL LOW (ref 98–111)
Creatinine, Ser: 1.22 mg/dL — ABNORMAL HIGH (ref 0.44–1.00)
GFR calc Af Amer: 52 mL/min — ABNORMAL LOW (ref >60)
GFR calc non Af Amer: 45 mL/min — ABNORMAL LOW (ref >60)
Glucose, Bld: 187 mg/dL — ABNORMAL HIGH (ref 70–99)
Potassium: 4.9 mmol/L (ref 3.5–5.1)
Sodium: 136 mmol/L (ref 135–145)
Total Bilirubin: 0.6 mg/dL (ref 0.3–1.2)
Total Protein: 6.4 g/dL — ABNORMAL LOW (ref 6.5–8.1)

## 2020-01-22 LAB — CBC
HCT: 36.3 % (ref 36.0–46.0)
Hemoglobin: 11 g/dL — ABNORMAL LOW (ref 12.0–15.0)
MCH: 28.9 pg (ref 26.0–34.0)
MCHC: 30.3 g/dL (ref 30.0–36.0)
MCV: 95.5 fL (ref 80.0–100.0)
Platelets: 142 10*3/uL — ABNORMAL LOW (ref 150–400)
RBC: 3.8 MIL/uL — ABNORMAL LOW (ref 3.87–5.11)
RDW: 13.4 % (ref 11.5–15.5)
WBC: 3.7 10*3/uL — ABNORMAL LOW (ref 4.0–10.5)
nRBC: 0 % (ref 0.0–0.2)

## 2020-01-22 LAB — I-STAT CHEM 8, ED
BUN: 26 mg/dL — ABNORMAL HIGH (ref 8–23)
Calcium, Ion: 1.2 mmol/L (ref 1.15–1.40)
Chloride: 95 mmol/L — ABNORMAL LOW (ref 98–111)
Creatinine, Ser: 1.2 mg/dL — ABNORMAL HIGH (ref 0.44–1.00)
Glucose, Bld: 183 mg/dL — ABNORMAL HIGH (ref 70–99)
HCT: 36 % (ref 36.0–46.0)
Hemoglobin: 12.2 g/dL (ref 12.0–15.0)
Potassium: 4.6 mmol/L (ref 3.5–5.1)
Sodium: 136 mmol/L (ref 135–145)
TCO2: 33 mmol/L — ABNORMAL HIGH (ref 22–32)

## 2020-01-22 LAB — HEMOGLOBIN A1C
Hgb A1c MFr Bld: 6.4 % — ABNORMAL HIGH (ref 4.8–5.6)
Mean Plasma Glucose: 136.98 mg/dL

## 2020-01-22 LAB — URINALYSIS, ROUTINE W REFLEX MICROSCOPIC
Bilirubin Urine: NEGATIVE
Glucose, UA: NEGATIVE mg/dL
Hgb urine dipstick: NEGATIVE
Ketones, ur: NEGATIVE mg/dL
Leukocytes,Ua: NEGATIVE
Nitrite: NEGATIVE
Protein, ur: 30 mg/dL — AB
Specific Gravity, Urine: 1.02 (ref 1.005–1.030)
pH: 5 (ref 5.0–8.0)

## 2020-01-22 LAB — APTT: aPTT: 28 seconds (ref 24–36)

## 2020-01-22 LAB — PROTIME-INR
INR: 1 (ref 0.8–1.2)
Prothrombin Time: 12.6 seconds (ref 11.4–15.2)

## 2020-01-22 LAB — DIFFERENTIAL
Abs Immature Granulocytes: 0.02 10*3/uL (ref 0.00–0.07)
Basophils Absolute: 0 10*3/uL (ref 0.0–0.1)
Basophils Relative: 1 %
Eosinophils Absolute: 0.3 10*3/uL (ref 0.0–0.5)
Eosinophils Relative: 8 %
Immature Granulocytes: 1 %
Lymphocytes Relative: 11 %
Lymphs Abs: 0.4 10*3/uL — ABNORMAL LOW (ref 0.7–4.0)
Monocytes Absolute: 0.6 10*3/uL (ref 0.1–1.0)
Monocytes Relative: 16 %
Neutro Abs: 2.4 10*3/uL (ref 1.7–7.7)
Neutrophils Relative %: 63 %

## 2020-01-22 LAB — RAPID URINE DRUG SCREEN, HOSP PERFORMED
Amphetamines: NOT DETECTED
Barbiturates: POSITIVE — AB
Benzodiazepines: NOT DETECTED
Cocaine: NOT DETECTED
Opiates: NOT DETECTED
Tetrahydrocannabinol: NOT DETECTED

## 2020-01-22 LAB — CBG MONITORING, ED: Glucose-Capillary: 128 mg/dL — ABNORMAL HIGH (ref 70–99)

## 2020-01-22 LAB — SARS CORONAVIRUS 2 BY RT PCR (HOSPITAL ORDER, PERFORMED IN ~~LOC~~ HOSPITAL LAB): SARS Coronavirus 2: NEGATIVE

## 2020-01-22 LAB — ETHANOL: Alcohol, Ethyl (B): <10 mg/dL (ref <10)

## 2020-01-22 MED ORDER — ACETAMINOPHEN 160 MG/5ML PO SOLN: 650.0000 mg | ORAL | Status: DC | PRN

## 2020-01-22 MED ORDER — ASPIRIN EC 81 MG PO TBEC
81.0000 mg | DELAYED_RELEASE_TABLET | Freq: Every day | ORAL | Status: DC
  Administered 2020-01-22 – 2020-01-26 (×5): 81 mg via ORAL
  Filled 2020-01-22 (×5): qty 1

## 2020-01-22 MED ORDER — ACETAMINOPHEN 325 MG PO TABS
650.0000 mg | ORAL_TABLET | ORAL | Status: DC | PRN
  Administered 2020-01-25: 650 mg via ORAL
  Filled 2020-01-22 (×2): qty 2

## 2020-01-22 MED ORDER — ATENOLOL 25 MG PO TABS
25.0000 mg | ORAL_TABLET | Freq: Every day | ORAL | Status: DC
  Administered 2020-01-22 – 2020-01-26 (×5): 25 mg via ORAL
  Filled 2020-01-22 (×5): qty 1

## 2020-01-22 MED ORDER — ACETAMINOPHEN 650 MG RE SUPP: 650.0000 mg | RECTAL | Status: DC | PRN

## 2020-01-22 MED ORDER — INSULIN ASPART 100 UNIT/ML ~~LOC~~ SOLN
0.0000 [IU] | Freq: Three times a day (TID) | SUBCUTANEOUS | Status: DC
  Administered 2020-01-24: 3 [IU] via SUBCUTANEOUS
  Administered 2020-01-24 (×2): 2 [IU] via SUBCUTANEOUS
  Administered 2020-01-25: 3 [IU] via SUBCUTANEOUS
  Administered 2020-01-25: 2 [IU] via SUBCUTANEOUS
  Administered 2020-01-26: 3 [IU] via SUBCUTANEOUS
  Administered 2020-01-26: 2 [IU] via SUBCUTANEOUS

## 2020-01-22 MED ORDER — ALBUTEROL SULFATE HFA 108 (90 BASE) MCG/ACT IN AERS
1.0000 | INHALATION_SPRAY | Freq: Four times a day (QID) | RESPIRATORY_TRACT | Status: DC | PRN
  Filled 2020-01-22: qty 6.7

## 2020-01-22 MED ORDER — MOMETASONE FURO-FORMOTEROL FUM 200-5 MCG/ACT IN AERO
2.0000 | INHALATION_SPRAY | Freq: Two times a day (BID) | RESPIRATORY_TRACT | Status: DC
  Administered 2020-01-22 – 2020-01-26 (×8): 2 via RESPIRATORY_TRACT
  Filled 2020-01-22: qty 8.8

## 2020-01-22 MED ORDER — SENNOSIDES-DOCUSATE SODIUM 8.6-50 MG PO TABS: 1.0000 | ORAL_TABLET | Freq: Every evening | ORAL | Status: DC | PRN

## 2020-01-22 MED ORDER — SODIUM CHLORIDE 0.9 % IV SOLN: INTRAVENOUS | Status: DC

## 2020-01-22 MED ORDER — GABAPENTIN 400 MG PO CAPS
800.0000 mg | ORAL_CAPSULE | Freq: Three times a day (TID) | ORAL | Status: DC
  Administered 2020-01-22: 800 mg via ORAL
  Filled 2020-01-22: qty 2

## 2020-01-22 MED ORDER — CARBIDOPA-LEVODOPA ER 61.25-245 MG PO CPCR: 1.0000 | ORAL_CAPSULE | Freq: Three times a day (TID) | ORAL | Status: DC

## 2020-01-22 MED ORDER — LAMOTRIGINE 100 MG PO TABS
200.0000 mg | ORAL_TABLET | Freq: Two times a day (BID) | ORAL | Status: DC
  Administered 2020-01-22 – 2020-01-26 (×8): 200 mg via ORAL
  Filled 2020-01-22 (×9): qty 2

## 2020-01-22 MED ORDER — GABAPENTIN 300 MG PO CAPS: 700.0000 mg | ORAL_CAPSULE | Freq: Two times a day (BID) | ORAL | Status: DC

## 2020-01-22 MED ORDER — STROKE: EARLY STAGES OF RECOVERY BOOK
Freq: Once | Status: AC
  Filled 2020-01-22 (×2): qty 1

## 2020-01-22 NOTE — ED Notes (Signed)
Sharlett Iles daughter [phone] would like an update on the pt

## 2020-01-22 NOTE — ED Provider Notes (Signed)
[facility] [address] [facility] EMERGENCY DEPARTMENT Provider Note   CSN: [id number] Arrival date & time: 01/22/20  1605     History Chief Complaint  Patient presents with  . Altered Mental Status    Sara Raymond is a 69 y.o. female presenting for evaluation of altered mental status, slurred speech, left-sided weakness.  Level 5 caveat due to difficulty understanding patient.  Initial history obtained from EMS.  Per EMS, patient fell yesterday, was evaluated by EMS but declined transport.  They were called out today due to abnormal speech and left-sided weakness.  Patient had a very difficult time walking to the stretcher due to left-sided weakness causing an ataxic gait.  Patient states that her speech abnormality is new, started yesterday.  She reports difficulty walking over the past several days.  She does not know her speech difficulty began before or after her fall yesterday.  Patient denies recent fevers, chills, chest pain, shortness breath, cough, abdominal pain, urinary symptoms, normal bowel movements.  She has a history of parkinsons, followed with with neurology.  She was started on a new medicine a few days ago, thinks this may be the cause for her symptoms.   Additional history obtained from patient's daughter.  Per daughter, patient had increased weakness several days ago.  This began after her visit with a neurologist.  Patient has been on the new medicine for several days.  Speech abnormality started yesterday afternoon, more than 24 hours ago.  Daughter reports no deficits from previous stroke.  Confirms patient is not on blood thinner.  Additional history obtained from chart review.  Patient with a history of diabetes, hypertension, Parkinson disease, sarcoidosis, history of seizures, CKD, previous CVA.   HPI     Past Medical History:  Diagnosis Date  . Asthma   . Diabetes mellitus without complication (HCC)   . Hypertension   . Parkinson disease (HCC)   .  Sarcoidosis   . Seizures [facility])     Patient Active Problem List   Diagnosis Date Noted  . Ataxia 01/22/2020  . Dysarthria 01/22/2020  . Generalized weakness 01/22/2020  . Acute CVA (cerebrovascular accident) (HCC) 08/23/2018  . TIA (transient ischemic attack) 08/22/2018  . Seizures (HCC) 08/22/2018  . Hypertension 08/22/2018  . 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 CKD (chronic kidney disease), stage III 08/22/2018  . Parkinson disease (HCC) 08/22/2018    Past Surgical History:  Procedure Laterality Date  . ABDOMINAL HYSTERECTOMY       OB History   No obstetric history on file.     Family History  Problem Relation Age of Onset  . Hypertension Other     Social History   Tobacco Use  . Smoking status: Never Smoker  . Smokeless tobacco: Never Used  Vaping Use  . Vaping Use: Never used  Substance Use Topics  . Alcohol use: No  . Drug use: No    Home Medications Prior to Admission medications   Medication Sig Start Date End Date Taking? Authorizing Provider  albuterol (PROAIR HFA) 108 (90 Base) MCG/ACT inhaler Inhale 1-2 puffs into the lungs every 6 (six) hours as needed for wheezing or shortness of breath.   Yes [provider]  aspirin EC 81 MG tablet Take 81 mg by mouth daily. Swallow whole.   Yes [provider]  atenolol (TENORMIN) 25 MG tablet Take 25 mg by mouth daily. 01/19/20  Yes [provider]  Carbidopa-Levodopa ER Blenda Peals) [phone]  MG CPCR Take 1 capsule by mouth 3 (three) times daily.   Yes [provider]  Fluticasone-Salmeterol (ADVAIR) 250-50 MCG/DOSE AEPB Inhale 1 puff into the lungs 2 (two) times daily as needed (shortness of breath/wheezing).   Yes [provider]  gabapentin (NEURONTIN) 800 MG tablet Take 800 mg by mouth 3 (three) times daily. 08/05/18  Yes [provider]  lamoTRIgine (LAMICTAL) 200 MG tablet Take 200 mg by mouth 2 (two) times daily.  07/02/18  Yes [provider]  naproxen sodium (ALEVE) 220 MG tablet Take 220-440 mg by mouth 2 (two) times daily as needed (for pain).   Yes [provider]  OXYGEN Inhale 3 L into the lungs continuous.   Yes [provider]  primidone (MYSOLINE) 50 MG tablet Take 50 mg by mouth 2 (two) times daily. 01/19/20  Yes [provider]  atorvastatin (LIPITOR) 80 MG tablet Take 1 tablet (80 mg total) by mouth daily at 6 PM. Patient not taking: Reported on 01/22/2020 08/24/18   Elease EtienneHongalgi, Anand D, MD  Carbidopa-Levodopa ER (RYTARY) 61.25-245 MG CPCR Take 1 capsule by mouth 3 (three) times daily.    [provider]    Allergies    Patient has no known allergies.  Review of Systems   Review of Systems  Neurological: Positive for speech difficulty and weakness.       Falls  All other systems reviewed and are negative.   Physical Exam Updated Vital Signs BP 136/80   Pulse 67   Temp (!) 97.2 F (36.2 C) (Oral)   Resp 17   Ht 5\' 3"  (1.6 m)   Wt 83 kg   SpO2 100%   BMI 32.41 kg/m   Physical Exam Vitals and nursing note reviewed.  Constitutional:      General: She is not in acute distress.    Appearance: She is well-developed.     Comments: Appears nontoxic  HENT:     Head: Normocephalic and atraumatic.  Eyes:     Extraocular Movements: Extraocular movements intact.     Conjunctiva/sclera: Conjunctivae normal.     Pupils: Pupils are equal, round, and reactive to light.  Cardiovascular:     Rate and Rhythm: Normal rate and regular rhythm.     Pulses: Normal pulses.  Pulmonary:     Effort: Pulmonary effort is normal. No respiratory distress.     Breath sounds: Normal breath sounds. No wheezing.  Abdominal:     General: There is no distension.     Palpations: Abdomen is soft. There is no mass.     Tenderness: There is no abdominal tenderness. There is no guarding or rebound.  Musculoskeletal:        General: Normal range of motion.      Cervical back: Normal range of motion and neck supple.  Skin:    General: Skin is warm and dry.     Capillary Refill: Capillary refill takes less than 2 seconds.  Neurological:     Mental Status: She is alert and oriented to person, place, and time.     GCS: GCS eye subscore is 4. GCS verbal subscore is 5. GCS motor subscore is 6.     Cranial Nerves: Cranial nerves are intact.     Sensory: Sensation is intact.     Motor: Motor function is intact.     Coordination: Coordination is intact.     Comments: Speech garbled, difficult to understand.  No gross weakness evaluation of upper or lower  extremities.  Pedal pulses 2+ bilaterally.  Sensation intact x4.  No facial droop noted on my exam, EMS reported a left-sided facial droop.     ED Results / Procedures / Treatments   Labs (all labs ordered are listed, but only abnormal results are displayed) Labs Reviewed  CBC - Abnormal; Notable for the following components:      Result Value   WBC 3.7 (*)    RBC 3.80 (*)    Hemoglobin 11.0 (*)    Platelets 142 (*)    All other components within normal limits  DIFFERENTIAL - Abnormal; Notable for the following components:   Lymphs Abs 0.4 (*)    All other components within normal limits  COMPREHENSIVE METABOLIC PANEL - Abnormal; Notable for the following components:   Chloride 96 (*)    Glucose, Bld 187 (*)    Creatinine, Ser 1.22 (*)    Total Protein 6.4 (*)    GFR calc non Af Amer 45 (*)    GFR calc Af Amer 52 (*)    All other components within normal limits  RAPID URINE DRUG SCREEN, HOSP PERFORMED - Abnormal; Notable for the following components:   Barbiturates POSITIVE (*)    All other components within normal limits  URINALYSIS, ROUTINE W REFLEX MICROSCOPIC - Abnormal; Notable for the following components:   APPearance HAZY (*)    Protein, ur 30 (*)    Bacteria, UA RARE (*)    All other components within normal limits  I-STAT CHEM 8, ED - Abnormal; Notable for the following  components:   Chloride 95 (*)    BUN 26 (*)    Creatinine, Ser 1.20 (*)    Glucose, Bld 183 (*)    TCO2 33 (*)    All other components within normal limits  CBG MONITORING, ED - Abnormal; Notable for the following components:   Glucose-Capillary 128 (*)    All other components within normal limits  SARS CORONAVIRUS 2 BY RT PCR (HOSPITAL ORDER, PERFORMED IN [facility])  ETHANOL  PROTIME-INR  APTT  HEMOGLOBIN A1C  LIPID PANEL  HIV ANTIBODY (ROUTINE TESTING W REFLEX)  HEMOGLOBIN A1C    EKG None  Radiology CT HEAD WO CONTRAST  Result Date: 01/22/2020 CLINICAL DATA:  Fall.  Altered mental status. EXAM: CT HEAD WITHOUT CONTRAST TECHNIQUE: Contiguous axial images were obtained from the base of the skull through the vertex without intravenous contrast. 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 There is atrophy and chronic small vessel disease changes. No acute intracranial abnormality. Specifically, no hemorrhage, hydrocephalus, mass lesion, acute infarction, or significant intracranial injury. Vascular: No hyperdense vessel or unexpected calcification. Skull: No acute calvarial abnormality. 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 Atrophy, chronic microvascular disease. No acute intracranial abnormality. Electronically Signed   By: Charlett Nose M.D.   On: 01/22/2020 16:38   MR BRAIN WO CONTRAST  Result Date: 01/22/2020 CLINICAL DATA:  Slurred speech with multiple falls. History of Parkinson's disease. EXAM: MRI HEAD WITHOUT CONTRAST TECHNIQUE: Multiplanar, multiecho pulse sequences of the brain and surrounding structures were obtained without intravenous contrast. COMPARISON:  None. FINDINGS: Brain: No acute infarct, acute hemorrhage or extra-axial collection. Multifocal white matter hyperintensity, most commonly due to chronic ischemic microangiopathy. Multiple old basal  ganglia small vessel infarct. No chronic microhemorrhage. Normal midline structures. Vascular: Normal flow voids. Skull and upper cervical spine: Normal marrow signal. Sinuses/Orbits: Negative. Other: None. IMPRESSION: 1. No acute  intracranial abnormality. 2. Multifocal white matter hyperintensity, most commonly due to chronic ischemic microangiopathy. 3. 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 Electronically Signed   By: Deatra Robinson M.D.   On: 01/22/2020 20:25   DG Chest Portable 1 View  Result Date: 01/22/2020 CLINICAL DATA:  Fall EXAM: PORTABLE CHEST 1 VIEW COMPARISON:  05/26/2019 FINDINGS: The heart size and mediastinal contours are within normal limits. Both lungs are clear. The visualized skeletal structures are unremarkable. IMPRESSION: Normal study. Electronically Signed   By: Charlett Nose M.D.   On: 01/22/2020 17:33    Procedures Procedures (including critical care time)  Medications Ordered in ED Medications  aspirin EC tablet 81 mg (has no administration in time range)  atenolol (TENORMIN) tablet 25 mg (has no administration in time range)  Carbidopa-Levodopa ER 61.25-245 MG CPCR 1 capsule (1 capsule Oral Not Given 01/22/20 2151)  gabapentin (NEURONTIN) capsule 800 mg (has no administration in time range)  lamoTRIgine (LAMICTAL) tablet 200 mg (has no administration in time range)  albuterol (VENTOLIN HFA) 108 (90 Base) MCG/ACT inhaler 1-2 puff (has no administration in time range)  mometasone-formoterol (DULERA) 200-5 MCG/ACT inhaler 2 puff (has no administration in time range)   stroke: mapping our early stages of recovery book (has no administration in time range)  0.9 %  sodium chloride infusion (has no administration in time range)  acetaminophen (TYLENOL) tablet 650 mg (has no administration in time range)    Or  acetaminophen (TYLENOL) 160 MG/5ML solution 650 mg (has no administration in time range)    Or  acetaminophen (TYLENOL) suppository 650 mg (has no  administration in time range)  senna-docusate (Senokot-S) tablet 1 tablet (has no administration in time range)  insulin aspart (novoLOG) injection 0-15 Units (has no administration in time range)    ED Course  I have reviewed the triage vital signs and the nursing notes.  Pertinent labs & imaging results that were available during my care of the patient were reviewed by me and considered in my medical decision making (see chart for details).    MDM Rules/Calculators/A&P                          Patient presented for evaluation of gait abnormality, frequent falls, slurred speech.  On exam, patient appears nontoxic.  Her speech is garbled, very difficult to understand.  However it appears these neurologic changes are new.  As patient had a fall, will obtain CT head to ensure no bleed.  Will obtain labs to ensure no electrolyte abnormality.  However patient will likely need an MRI for definitive stroke rule out.  Discussed with attending, Dr. Pilar Plate evaluated the patient.  Labs interpreted by me, overall reassuring.  No significant electrolyte abnormality.  CT head negative for acute findings.  Case discussed with Dr. Amada Jupiter from neurology, they will evaluate the patient.  Will admit to hospitalist service for further evaluation of acute neuro abnormalities and stroke rule out.  Discussed with Dr. Rachael Darby from [facility] hospitalist service, pt to be admitted.   Final Clinical Impression(s) / ED Diagnoses Final diagnoses:  Difficulty with speech  Left-sided weakness  Fall, initial encounter    Rx / DC Orders ED Discharge Orders    None       Alveria Apley, PA-C 01/22/20 2202    Sabas Sous, MD 01/25/20 [phone]

## 2020-01-22 NOTE — H&P (Signed)
History and Physical    Sara Raymond SPQ:[id number] DOB: 04-13-1951 DOA: 01/22/2020  PCP: Angelica Chessman, MD   Patient coming from:  Home   Chief Complaint: difficulty with speech, off balance, fell at home  HPI: Sara Raymond is a 69 y.o. female with medical history significant for seizures, Parkinson's Disease, asthma, HTN and DMII who presents today with an increased frequency of falling and dysarthria. [name]'s daughter reported to the ER provider that the dysarthria began yesterday and the falling began approximately three weeks ago, with the worst of it occurring over the past couple of days (3 x falls 06Aug2021; 1 bad fall yesterday - EMS called for wound care). The ataxia has worsened over the past few days. She refused to be transported by EMS after her fall yesterday when she cut her arm on glass that broke when she fell. She has also been unable to feed herself as of yesterday. [name] refused to go to the hospital last night, and was requested to go today by the family. She has not experienced these symptoms previously. All medications are being taken as prescribed. No recent seizure activity.  [name] began taking Trimidone 50mg  BID on 05Aug2021 PM. With concern that this was causing the new onset of falling, the daughter called neurologist today and was given permission to discontinue it.  Denies tobacco, alcohol, illicit drug use  ED Course: [name] continues to have difficulty speaking and has generalized weakness while in the emergency room.  CT the head is negative.  Patient was seen by neurology recommended MRI of the brain and further work-up and to have the hospital service observe patient during work-up  Review of Systems:  General: Reports weakness and has had falls at home.  Has had dizziness.  Denies fever, chills, weight loss, night sweats. Denies change in appetite HENT: Denies head trauma, headache, denies change in hearing, tinnitus.   Denies nasal congestion or bleeding.  Denies sore throat, sores in mouth.  Denies difficulty swallowing Eyes: Denies blurry vision, pain in eye, drainage.  Denies discoloration of eyes. Neck: Denies pain.  Denies swelling.  Denies pain with movement. Cardiovascular: Denies chest pain, palpitations.  Denies edema.  Denies orthopnea Respiratory: Denies shortness of breath, cough.  Denies wheezing.  Denies sputum production Gastrointestinal: Denies abdominal pain, swelling.  Denies nausea, vomiting, diarrhea.  Denies melena.  Denies hematemesis. Musculoskeletal: Denies limitation of movement.  Has had falls at home and generalized weakness.  Denies deformity or swelling.  Denies pain.  Denies arthralgias or myalgias. Genitourinary: Denies pelvic pain.  Denies urinary frequency or hesitancy.  Denies dysuria.  Skin: Denies rash.  Has cut on the left forearm after fall at home yesterday, dressing in place and clean and dry.  Denies petechiae, purpura, ecchymosis. Neurological: Denies headache.  Denies syncope.  Denies seizure activity.  Has chronic tremor of hands.  Denies weakness or paresthesia.  Denies slurred speech, drooping face.  Denies visual change. Psychiatric: Denies depression, anxiety.  Denies suicidal thoughts or ideation.  Denies hallucinations.  Past Medical History:  Diagnosis Date   Asthma    Diabetes mellitus without complication (HCC)    Hypertension    Parkinson disease (HCC)    Sarcoidosis    Seizures (HCC)     Past Surgical History:  Procedure Laterality Date   ABDOMINAL HYSTERECTOMY      Social History  reports that she has never smoked. She has never used smokeless tobacco. She reports that she does not drink  alcohol and does not use drugs.  No Known Allergies  Family History  Problem Relation Age of Onset   Hypertension Other      Prior to Admission medications   Medication Sig Start Date End Date Taking? Authorizing Provider  albuterol (PROAIR HFA)  108 (90 Base) MCG/ACT inhaler Inhale 1-2 puffs into the lungs every 6 (six) hours as needed for wheezing or shortness of breath.   Yes [provider]  aspirin EC 81 MG tablet Take 81 mg by mouth daily. Swallow whole.   Yes [provider]  atenolol (TENORMIN) 25 MG tablet Take 25 mg by mouth daily. 01/19/20  Yes [provider]  Carbidopa-Levodopa ER (RYTARY) 48.75-195 MG CPCR Take 1 capsule by mouth 3 (three) times daily.   Yes [provider]  Fluticasone-Salmeterol (ADVAIR) 250-50 MCG/DOSE AEPB Inhale 1 puff into the lungs 2 (two) times daily as needed (shortness of breath/wheezing).   Yes [provider]  gabapentin (NEURONTIN) 800 MG tablet Take 800 mg by mouth 3 (three) times daily. 08/05/18  Yes [provider]  lamoTRIgine (LAMICTAL) 200 MG tablet Take 200 mg by mouth 2 (two) times daily. 07/02/18  Yes [provider]  naproxen sodium (ALEVE) 220 MG tablet Take 220-440 mg by mouth 2 (two) times daily as needed (for pain).   Yes [provider]  OXYGEN Inhale 3 L into the lungs continuous.   Yes [provider]  primidone (MYSOLINE) 50 MG tablet Take 50 mg by mouth 2 (two) times daily. 01/19/20  Yes [provider]  atorvastatin (LIPITOR) 80 MG tablet Take 1 tablet (80 mg total) by mouth daily at 6 PM. Patient not taking: Reported on 01/22/2020 08/24/18   [name], MD  Carbidopa-Levodopa ER (RYTARY) 61.25-245 MG CPCR Take 1 capsule by mouth 3 (three) times daily.    [provider]    Physical Exam: Vitals:   01/22/20 1606 01/22/20 1623 01/22/20 1645 01/22/20 1900  BP: (!) 146/78   136/80  Pulse: 83   67  Resp: 17   17  Temp:   (!) 97.2 F (36.2 C)   TempSrc:   Oral   SpO2: 100% 100%  100%  Weight:   83 kg   Height:   5\' 3"  (1.6 m)     Constitutional: NAD, patient appears drowsy but is responsive to voice Vitals:   01/22/20 1606 01/22/20 1623 01/22/20 1645 01/22/20 1900  BP: (!)  146/78   136/80  Pulse: 83   67  Resp: 17   17  Temp:   (!) 97.2 F (36.2 C)   TempSrc:   Oral   SpO2: 100% 100%  100%  Weight:   83 kg   Height:   5\' 3"  (1.6 m)    General: WDWN, Alert and oriented x3.  Patient is Eyes: EOMI, PERRL, lids and conjunctivae normal.  Sclera nonicteric.  No nystagmus HENT:  [place]/AT, external ears normal.  Nares patent without epistasis.  Mucous membranes are moist. Posterior pharynx clear of any exudate or lesions.  Neck: Soft, normal range of motion, supple, no masses, no thyromegaly.  Trachea midline Respiratory: clear to auscultation bilaterally, no wheezing, no crackles. Normal respiratory effort. No accessory muscle use.  Cardiovascular: Regular rate and rhythm, no murmurs / rubs / gallops. No extremity edema. 1+ pedal pulses. No carotid bruits.  Abdomen: Soft, no tenderness, nondistended, no rebound or guarding.  No masses palpated. Bowel sounds normoactive Musculoskeletal: FROM. no clubbing / cyanosis.  Has mild tremor of the hands bilaterally.  No joint deformity upper and lower extremities. no contractures. Normal muscle tone.  Skin: Warm, dry, intact no rashes, lesions, ulcers. No induration.  Clean and dry dressing to left forearm in place. Neurologic: CN 2-12 grossly intact.  Speech is slurred and difficult to understand.  Sensation intact, patella DTR +1 bilaterally. Strength 5/5 in all extremities.  No pronator drift. Psychiatric: Normal judgment and insight.  Normal mood.    Labs on Admission: I have personally reviewed following labs and imaging studies  CBC: Recent Labs  Lab 01/22/20 1655 01/22/20 1703  WBC 3.7*  --   NEUTROABS 2.4  --   HGB 11.0* 12.2  HCT 36.3 36.0  MCV 95.5  --   PLT 142*  --     Basic Metabolic Panel: Recent Labs  Lab 01/22/20 1655 01/22/20 1703  NA 136 136  K 4.9 4.6  CL 96* 95*  CO2 31  --   GLUCOSE 187* 183*  BUN 22 26*  CREATININE 1.22* 1.20*  CALCIUM 9.0  --     GFR: Estimated Creatinine  Clearance: 44.5 mL/min (A) (by C-G formula based on SCr of 1.2 mg/dL (H)).  Liver Function Tests: Recent Labs  Lab 01/22/20 1655  AST 21  ALT 6  ALKPHOS 79  BILITOT 0.6  PROT 6.4*  ALBUMIN 3.6    Urine analysis:    Component Value Date/Time   COLORURINE YELLOW 01/22/2020 1948   APPEARANCEUR HAZY (A) 01/22/2020 1948   LABSPEC 1.020 01/22/2020 1948   PHURINE 5.0 01/22/2020 1948   GLUCOSEU NEGATIVE 01/22/2020 1948   HGBUR NEGATIVE 01/22/2020 1948   BILIRUBINUR NEGATIVE 01/22/2020 1948   KETONESUR NEGATIVE 01/22/2020 1948   PROTEINUR 30 (A) 01/22/2020 1948   UROBILINOGEN 1.0 12/02/2012 1907   NITRITE NEGATIVE 01/22/2020 1948   LEUKOCYTESUR NEGATIVE 01/22/2020 1948    Radiological Exams on Admission: CT HEAD WO CONTRAST  Result Date: 01/22/2020 CLINICAL DATA:  Fall.  Altered mental status. EXAM: CT HEAD WITHOUT CONTRAST TECHNIQUE: Contiguous axial images were obtained from the base of the skull through the vertex without intravenous contrast. 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 There is atrophy and chronic small vessel disease changes. No acute intracranial abnormality. Specifically, no hemorrhage, hydrocephalus, mass lesion, acute infarction, or significant intracranial injury. Vascular: No hyperdense vessel or unexpected calcification. Skull: No acute calvarial abnormality. 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 Atrophy, chronic microvascular disease. No acute intracranial abnormality. Electronically Signed   By: Charlett Nose M.D.   On: 01/22/2020 16:38   MR BRAIN WO CONTRAST  Result Date: 01/22/2020 CLINICAL DATA:  Slurred speech with multiple falls. History of Parkinson's disease. EXAM: MRI HEAD WITHOUT CONTRAST TECHNIQUE: Multiplanar, multiecho pulse sequences of the brain and surrounding structures were obtained without intravenous contrast. COMPARISON:  None.  FINDINGS: Brain: No acute infarct, acute hemorrhage or extra-axial collection. Multifocal white matter hyperintensity, most commonly due to chronic ischemic microangiopathy. 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 No chronic microhemorrhage. Normal midline structures. Vascular: Normal flow voids. Skull and upper cervical spine: Normal marrow signal. Sinuses/Orbits: Negative. Other: None. IMPRESSION: 1. No acute intracranial abnormality. 2. Multifocal white matter hyperintensity, most commonly due to chronic ischemic microangiopathy. 3. 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 Electronically Signed   By: Deatra Robinson M.D.   On: 01/22/2020 20:25   DG Chest Portable 1 View  Result Date: 01/22/2020 CLINICAL DATA:  Fall EXAM:  PORTABLE CHEST 1 VIEW COMPARISON:  05/26/2019 FINDINGS: The heart size and mediastinal contours are within normal limits. Both lungs are clear. The visualized skeletal structures are unremarkable. IMPRESSION: Normal study. Electronically Signed   By: Charlett NoseKevin  Dover M.D.   On: 01/22/2020 17:33    Assessment/Plan Principal Problem:   Ataxia Orson AloeHenderson will be observed on medical telemetry floor. Just been consulted awaiting their recommendations. MRI of the brain is been ordered and results are pending. Consult physical therapy/Occupational Therapy morning for evaluation. Patient placed on fall precautions  Active Problems:   Dysarthria  Speech therapy consulted to see patient in the morning    Hypertension Continue home medications.  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  Signs gone denies need for glycemic control.  Check hemoglobin A1c    Parkinson disease (HCC) Continue Sinemet.  Neurology following    Generalized weakness Consult PT/OT.  Fall precautions    DVT prophylaxis: SCDs for DVT prophylaxis.  [place] score elevated.  Anticoagulation is held until after acute CVA is  ruled out Code Status:   Full code Family Communication:  Diagnosed plan discussed with patient.  Patient agrees with plan.  Further recommendations to follow as clinical indicated Disposition Plan:   Patient is from:  Home  Anticipated DC to:  Home  Anticipated DC date:  Anticipate less than 2 midnight stay in the hospital  Anticipated DC barriers: Patient may need to have home health services set up before discharge  Consults called:  Neurology Admission status:  Observation  Severity of Illness: The appropriate patient status for this patient is OBSERVATION. Observation status is judged to be reasonable and necessary in order to provide the required intensity of service to ensure the patient's safety. The patient's presenting symptoms, physical exam findings, and initial radiographic and laboratory data in the context of their medical condition is felt to place them at decreased risk for further clinical deterioration. Furthermore, it is anticipated that the patient will be medically stable for discharge from the hospital within 2 midnights of admission. The following factors support the patient status of observation.   " The patient's presenting symptoms include difficulty with speech, generalized weakness, unsteady gait. " The physical exam findings include generalized weakness, dysarthria. " The initial radiographic and laboratory data are CT of the head is negative.  Chest x-ray is negative.      Claudean SeveranceBradley S Kemuel Buchmann MD [facility]  How to contact the [facility] Attending or Consulting provider 7A - 7P or covering provider during after hours 7P -7A, for this patient?   1. Check the care team in [facility] and look for a) attending/consulting [facility] provider listed and b) the [facility] team listed 2. Log into [email or web address] and use [place]'s universal password to access. If you do not have the password, please contact the hospital operator. 3. Locate the [facility] provider you are looking for under  [facility] and page to a number that you can be directly reached. 4. If you still have difficulty reaching the provider, please page the [facility] (Director on Call) for the Hospitalists listed on amion for assistance.  01/22/2020, 8:40 PM

## 2020-01-22 NOTE — ED Triage Notes (Signed)
Pt transported from home by EMS with AMS, pt had a fall last night resulting in skin tear to arm, pt refused transported. Pt noted to have slurred speech last night, pt had change in her parkinsons meds Thursday, IV est 250 NS bolus given.

## 2020-01-22 NOTE — ED Notes (Signed)
Pt in CT at this time.

## 2020-01-22 NOTE — Consult Note (Addendum)
NEURO HOSPITALIST CONSULT NOTE   Requesting physician: Dr. Sedonia Small  Reason for Consult: Worsening fall frequency, dysarthria  History obtained from:  Patient, daughter  HPI:                                                                                                                                          Sara Raymond is a 69 y.o. female with a past medical history significant for seizures, Parkinson's Disease, HTN and DMII who presents today with an increased frequency of falling and dysarthria. Sara Raymond's daughter states that the dysarthria began yesterday and the falling began approximately three weeks ago, with the worst of it occurring over the past couple of days (3 x falls 06Aug2021; 1 bad fall yesterday - EMS called for wound care). She has also been unable to feed herself as of yesterday. Sara Raymond refused to go to the hospital last night, and was requested to go today by the family. She has not experienced these symptoms previously. All medications are being taken as prescribed. No recent seizure activity.  Sara Raymond began taking Trimidone 69m BID on 05Aug2021 PM. With concern that this was causing the new onset of falling, the daughter called neurologist today and was given permission to discontinue it.  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 Diabetes mellitus without complication (HCC) [place] Hypertension   . Parkinson disease (HCC) [place] Sarcoidosis   . Seizures (HCC) [place] Past Surgical History:  Procedure Laterality Date  . ABDOMINAL HYSTERECTOMY      Family History  Problem Relation Age of Onset  . Hypertension Other      Social History:  reports that she has never smoked. She has never used smokeless tobacco. She reports that she does not drink alcohol and does not use drugs.  No Known Allergies  MEDICATIONS:  No outpatient medications have been marked as taking for the 01/22/20 encounter [facility] Encounter).     Review Of Systems:                                                                                                           History obtained from the patient, daughter  General: Negative for weight gain or weight loss Psychological: Negative for any known behavioral disorder Ophthalmic: Negative for blurry or double vision, loss of vision in any field; recent cataract surgery in the left eye ENT: Negative for abrupt loss of hearing; occasionally dizzy before falling Respiratory: Negative for cough, shortness of breath Cardiovascular: Negative for chest pain, edema or irregular heartbeat Gastrointestinal: Negative for nausea/vomiting  Musculoskeletal: Negative for joint swelling or muscular weakness Neurological: As noted in HPI Dermatological: Negative for rash or skin changes, numbness or tingling  Blood pressure (!) 146/78, pulse 83, temperature (!) 97.2 F (36.2 C), temperature source Oral, resp. rate 17, height _0  (1.6 m), weight 83 kg, SpO2 100 %.   Physical Examination:                                                                                                      General: WDWN female. Appears calm and comfortable in bed HEENT:  Normocephalic, no lesions, without obvious abnormality.  Normal external eye and external ears. Normal external nose. Normal pharynx. Cardiovascular: RRR, pulses palpable throughout   Pulmonary: Breathing comfortably on room air Abdomen: Soft, non-tender Extremities: no joint deformities, effusion, or inflammation and no edema; Bruises  present on the right thigh and knee, Sara Raymond states from falling Musculoskeletal: No joint tenderness, deformity or swelling. Tone and bulk normal throughout Skin: warm and dry, no hyperpigmentation, vitiligo, or suspicious lesions  Neurological Examination:                                                                                               Mental Status: Sara Raymond is alert, oriented x 3, thought content appropriate.  Speech dysarthric without evidence of aphasia. Able to follow 3-step commands without difficulty. Cranial Nerves: II: Right visual field grossly normal. Left visual field limited, pupils are equal, round, right pupil reactive to light; left pupil non-reactive III,IV, VI: Ptosis  not present, extra-ocular muscle movements intact bilaterally. Nystagmus present during lateral movement V,VII: Smile is drooping on the right; eyebrow raise is symmetric. Facial light touch sensation reduced on the right VIII: Hearing grossly intact IX,X: Uvula and palate rise symmetrically XI: SCM and bilateral shoulder shrug strength 5/5 XII: Midline tongue extension Motor: tremor present Right :     Upper extremity   5/5   Left:     Upper extremity   5/5          Lower extremity   5/5     Lower extremity   5/5 Pronator drift not present Sensory: Light touch reduced on the right bilaterally; Cold touch reduced on the right. Deep Tendon Reflexes: 2+ and symmetric throughout Plantars: Right: downgoing   Left: downgoing Cerebellar: Finger-to-nose test slow and with significant ataxia. Dysmetria present when testing the left hand in the right visual field.  Proprioception: Vibratory sense intact, slow. Gait: Not tested   Lab Results: Basic Metabolic Panel: Recent Labs  Lab 01/22/20 1655 01/22/20 1703  NA 136 136  K 4.9 4.6  CL 96* 95*  CO2 31  --   GLUCOSE 187* 183*  BUN 22 26*  CREATININE 1.22* 1.20*  CALCIUM 9.0  --     Liver Function Tests: Recent Labs  Lab  01/22/20 1655  AST 21  ALT 6  ALKPHOS 79  BILITOT 0.6  PROT 6.4*  ALBUMIN 3.6   No results for input(s): LIPASE, AMYLASE in the last 168 hours. No results for input(s): AMMONIA in the last 168 hours.  CBC: Recent Labs  Lab 01/22/20 1655 01/22/20 1703  WBC 3.7*  --   NEUTROABS 2.4  --   HGB 11.0* 12.2  HCT 36.3 36.0  MCV 95.5  --   PLT 142*  --     Cardiac Enzymes: No results for input(s): CKTOTAL, CKMB, CKMBINDEX, TROPONINI in the last 168 hours.  Lipid Panel: No results for input(s): CHOL, TRIG, HDL, CHOLHDL, VLDL, LDLCALC in the last 168 hours.  CBG: No results for input(s): GLUCAP in the last 168 hours.  Microbiology: Results for orders placed or performed during the hospital encounter of 05/26/19  Blood Culture (routine x 2)     Status: None   Collection Time: 05/26/19  8:06 PM   Specimen: BLOOD  Result Value Ref Range Status   Specimen Description   Final    BLOOD RIGHT ANTECUBITAL Performed at [facility], [address] [place], [place] [address]    Special Requests   Final    BOTTLES DRAWN AEROBIC AND ANAEROBIC Blood Culture adequate volume Performed at [facility], [address], [place], [place] [address]    Culture   Final    NO GROWTH 5 DAYS Performed at [facility], [place] [address], [place], [place] [address]    Report Status 05/31/2019 FINAL  Final  Blood Culture (routine x 2)     Status: None   Collection Time: 05/26/19  8:06 PM   Specimen: BLOOD  Result Value Ref Range Status   Specimen Description   Final    BLOOD LEFT ANTECUBITAL Performed at [facility], [address] [place], [place] [address]    Special Requests   Final    BOTTLES DRAWN AEROBIC AND ANAEROBIC Blood Culture adequate volume Performed at [facility], [address] [place], [place] [address]    Culture   Final  NO GROWTH 5 DAYS Performed at [facility], [place] [address] [place], [place] [address]     Report Status 05/31/2019 FINAL  Final  SARS Coronavirus 2 Ag (30 min TAT) - Nasal Swab (BD Veritor Kit)     Status: None   Collection Time: 05/26/19  8:30 PM   Specimen: Nasal Swab (BD Veritor Kit)  Result Value Ref Range Status   SARS Coronavirus 2 Ag NEGATIVE NEGATIVE Final    Comment: (NOTE) SARS-CoV-2 antigen NOT DETECTED.  Negative results are presumptive.  Negative results do not preclude SARS-CoV-2 infection and should not be used as the sole basis for treatment or other patient management decisions, including infection  control decisions, particularly in the presence of clinical signs and  symptoms consistent with COVID-19, or in those who have been in contact with the virus.  Negative results must be combined with clinical observations, patient history, and epidemiological information. The expected result is Negative. Fact Sheet for Patients: PodPark.tn Fact Sheet for Healthcare Providers: GiftContent.is This test is not yet approved or cleared by the [place] FDA and  has been authorized for detection and/or diagnosis of SARS-CoV-2 by FDA under an Emergency Use Authorization (EUA).  This EUA will remain in effect (meaning this test can be used) for the duration of  the COVID-19 de claration under Section 564(b)(1) of the Act, 21 U.S.C. section 360bbb-3(b)(1), unless the authorization is terminated or revoked sooner. Performed at [facility], [address] [place], [place] [address]   Novel Coronavirus, NAA [facility] order, Send-out to Ref Lab; TAT 18-24 hrs     Status: None   Collection Time: 05/26/19 11:38 PM   Specimen: Nasopharyngeal Swab; Respiratory  Result Value Ref Range Status   SARS-CoV-2, NAA NOT DETECTED NOT DETECTED Final    Comment: (NOTE) This nucleic acid amplification test was developed and its performance characteristics determined by [employer]. Nucleic acid  amplification tests include PCR and TMA. This test has not been FDA cleared or approved. This test has been authorized by FDA under an Emergency Use Authorization (EUA). This test is only authorized for the duration of time the declaration that circumstances exist justifying the authorization of the emergency use of in vitro diagnostic tests for detection of SARS-CoV-2 virus and/or diagnosis of COVID-19 infection under section 564(b)(1) of the Act, 21 U.S.C. 465KPT-4(S) (1), unless the authorization is terminated or revoked sooner. When diagnostic testing is negative, the possibility of a false negative result should be considered in the context of a patient's recent exposures and the presence of clinical signs and symptoms consistent with COVID-19. An individual without symptoms of COVID- 19 and who is not shedding SARS-CoV-2 vi rus would expect to have a negative (not detected) result in this assay. Performed At: [facility] [address] [place], [place] [id number] Rush Farmer MD GY:[id number]    [place]  Final    Comment: Performed at [facility], [address] [place], [place] [address]    Coagulation Studies: Recent Labs    01/22/20 1655  LABPROT 12.6  INR 1.0    Imaging: CT HEAD WO CONTRAST  Result Date: 01/22/2020 CLINICAL DATA:  Fall.  Altered mental status. EXAM: CT HEAD WITHOUT CONTRAST TECHNIQUE: Contiguous axial images were obtained from the base of the skull through the vertex without intravenous contrast. 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 There is atrophy and chronic small vessel disease changes. No acute  intracranial abnormality. Specifically, no hemorrhage, hydrocephalus, mass lesion, acute infarction, or significant intracranial injury. Vascular: No hyperdense vessel or unexpected calcification. Skull: No acute calvarial abnormality. 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 Atrophy, chronic microvascular disease. No acute intracranial abnormality. Electronically Signed   By: Rolm Baptise M.D.   On: 01/22/2020 16:38   DG Chest Portable 1 View  Result Date: 01/22/2020 CLINICAL DATA:  Fall EXAM: PORTABLE CHEST 1 VIEW COMPARISON:  05/26/2019 FINDINGS: The heart size and mediastinal contours are within normal limits. Both lungs are clear. The visualized skeletal structures are unremarkable. IMPRESSION: Normal study. Electronically Signed   By: Rolm Baptise M.D.   On: 01/22/2020 17:33     Thank you for consulting the [facility] team. Assessment and plan per attending neurologist.   Solon Augusta PA-C [facility]  01/22/2020, 6:42 PM    NEUROHOSPITALIST ADDENDUM Performed a face to face diagnostic evaluation.   I have reviewed the contents of history and physical exam as documented by PA/ARNP/Resident and agree with above documentation.  I have discussed and formulated the above plan as documented. Edits to the note have been made as needed.  Assessment and Plan:  69 year old female with past medical history significant for seizures, Parkinson's disease, essential tremor, hypertension, diabetes mellitus, COPD who presents to the emergency department after daughter noticed last dysarthria since yesterday as well as patient having multiple falls since 3 weeks.  She was recently started on primidone for essential tremors by her neurologist at [facility].  On examination, it was noted that patient had significant asterixis rather then dysmetria.  Patient underwent MRI brain which was negative for acute stroke, showed chronic white matter disease.  Suspect this is likely drug related, addition of primidone to already high dose of Neurontin may be causing patient to have increased dizziness and falls.   Toxic metabolic encephalopathy:   Recommendations Patient  on Neurontin 800 mg 3 times daily-reduce dose to 400 twice daily for now Continue lamotrigine 20 mg twice daily  Continue Rytary 1 capsule 3 times a day Continue metoprolol, hold primidone Check ABG, infectious work-up including UA CBC CMP, UDS IV fluids Neurochecks   Karena Addison Jandy Brackens MD [facility] [phone]   If 7pm to 7am, please call on call as listed on AMION.

## 2020-01-22 NOTE — ED Notes (Signed)
Pt's daughter Lissa Hoard updated

## 2020-01-23 ENCOUNTER — Observation Stay (HOSPITAL_COMMUNITY): Payer: Medicare Other

## 2020-01-23 DIAGNOSIS — R296 Repeated falls: Secondary | ICD-10-CM | POA: Diagnosis present

## 2020-01-23 DIAGNOSIS — G20 Parkinson's disease: Secondary | ICD-10-CM | POA: Diagnosis present

## 2020-01-23 DIAGNOSIS — G92 Toxic encephalopathy: Secondary | ICD-10-CM | POA: Diagnosis present

## 2020-01-23 DIAGNOSIS — E1122 Type 2 diabetes mellitus with diabetic chronic kidney disease: Secondary | ICD-10-CM | POA: Diagnosis present

## 2020-01-23 DIAGNOSIS — R278 Other lack of coordination: Secondary | ICD-10-CM | POA: Diagnosis present

## 2020-01-23 DIAGNOSIS — G459 Transient cerebral ischemic attack, unspecified: Secondary | ICD-10-CM

## 2020-01-23 DIAGNOSIS — I351 Nonrheumatic aortic (valve) insufficiency: Secondary | ICD-10-CM

## 2020-01-23 DIAGNOSIS — G250 Essential tremor: Secondary | ICD-10-CM | POA: Diagnosis present

## 2020-01-23 DIAGNOSIS — R471 Dysarthria and anarthria: Secondary | ICD-10-CM | POA: Diagnosis present

## 2020-01-23 DIAGNOSIS — Z79899 Other long term (current) drug therapy: Secondary | ICD-10-CM | POA: Diagnosis not present

## 2020-01-23 DIAGNOSIS — G928 Other toxic encephalopathy: Secondary | ICD-10-CM | POA: Diagnosis present

## 2020-01-23 DIAGNOSIS — T426X5A Adverse effect of other antiepileptic and sedative-hypnotic drugs, initial encounter: Secondary | ICD-10-CM | POA: Diagnosis present

## 2020-01-23 DIAGNOSIS — N183 Chronic kidney disease, stage 3 unspecified: Secondary | ICD-10-CM | POA: Diagnosis present

## 2020-01-23 DIAGNOSIS — Z8249 Family history of ischemic heart disease and other diseases of the circulatory system: Secondary | ICD-10-CM | POA: Diagnosis not present

## 2020-01-23 DIAGNOSIS — Z9981 Dependence on supplemental oxygen: Secondary | ICD-10-CM | POA: Diagnosis not present

## 2020-01-23 DIAGNOSIS — I129 Hypertensive chronic kidney disease with stage 1 through stage 4 chronic kidney disease, or unspecified chronic kidney disease: Secondary | ICD-10-CM | POA: Diagnosis present

## 2020-01-23 DIAGNOSIS — G9341 Metabolic encephalopathy: Secondary | ICD-10-CM | POA: Diagnosis present

## 2020-01-23 DIAGNOSIS — I361 Nonrheumatic tricuspid (valve) insufficiency: Secondary | ICD-10-CM

## 2020-01-23 DIAGNOSIS — R270 Ataxia, unspecified: Secondary | ICD-10-CM | POA: Diagnosis not present

## 2020-01-23 DIAGNOSIS — D869 Sarcoidosis, unspecified: Secondary | ICD-10-CM | POA: Diagnosis present

## 2020-01-23 DIAGNOSIS — Z7982 Long term (current) use of aspirin: Secondary | ICD-10-CM | POA: Diagnosis not present

## 2020-01-23 DIAGNOSIS — Y92009 Unspecified place in unspecified non-institutional (private) residence as the place of occurrence of the external cause: Secondary | ICD-10-CM | POA: Diagnosis not present

## 2020-01-23 DIAGNOSIS — J449 Chronic obstructive pulmonary disease, unspecified: Secondary | ICD-10-CM | POA: Diagnosis present

## 2020-01-23 DIAGNOSIS — Z20822 Contact with and (suspected) exposure to covid-19: Secondary | ICD-10-CM | POA: Diagnosis present

## 2020-01-23 DIAGNOSIS — R569 Unspecified convulsions: Secondary | ICD-10-CM | POA: Diagnosis present

## 2020-01-23 DIAGNOSIS — R531 Weakness: Secondary | ICD-10-CM | POA: Diagnosis present

## 2020-01-23 LAB — LIPID PANEL
Cholesterol: 246 mg/dL — ABNORMAL HIGH (ref 0–200)
HDL: 50 mg/dL (ref >40)
LDL Cholesterol: 171 mg/dL — ABNORMAL HIGH (ref 0–99)
Total CHOL/HDL Ratio: 4.9 RATIO
Triglycerides: 127 mg/dL (ref <150)
VLDL: 25 mg/dL (ref 0–40)

## 2020-01-23 LAB — I-STAT ARTERIAL BLOOD GAS, ED
Acid-Base Excess: 8 mmol/L — ABNORMAL HIGH (ref 0.0–2.0)
Bicarbonate: 35.7 mmol/L — ABNORMAL HIGH (ref 20.0–28.0)
Calcium, Ion: 1.23 mmol/L (ref 1.15–1.40)
HCT: 34 % — ABNORMAL LOW (ref 36.0–46.0)
Hemoglobin: 11.6 g/dL — ABNORMAL LOW (ref 12.0–15.0)
O2 Saturation: 99 %
Patient temperature: 98.6
Potassium: 4.4 mmol/L (ref 3.5–5.1)
Sodium: 139 mmol/L (ref 135–145)
TCO2: 38 mmol/L — ABNORMAL HIGH (ref 22–32)
pCO2 arterial: 65.5 mmHg (ref 32.0–48.0)
pH, Arterial: 7.344 — ABNORMAL LOW (ref 7.350–7.450)
pO2, Arterial: 135 mmHg — ABNORMAL HIGH (ref 83.0–108.0)

## 2020-01-23 LAB — HEMOGLOBIN A1C
Hgb A1c MFr Bld: 6.4 % — ABNORMAL HIGH (ref 4.8–5.6)
Mean Plasma Glucose: 136.98 mg/dL

## 2020-01-23 LAB — ECHOCARDIOGRAM COMPLETE
Area-P 1/2: 3.85 cm2
Height: 63 in
S' Lateral: 2.2 cm
Weight: 2927.71 oz

## 2020-01-23 LAB — GLUCOSE, CAPILLARY
Glucose-Capillary: 100 mg/dL — ABNORMAL HIGH (ref 70–99)
Glucose-Capillary: 253 mg/dL — ABNORMAL HIGH (ref 70–99)

## 2020-01-23 LAB — HIV ANTIBODY (ROUTINE TESTING W REFLEX): HIV Screen 4th Generation wRfx: NONREACTIVE

## 2020-01-23 LAB — CBG MONITORING, ED
Glucose-Capillary: 120 mg/dL — ABNORMAL HIGH (ref 70–99)
Glucose-Capillary: 115 mg/dL — ABNORMAL HIGH (ref 70–99)

## 2020-01-23 MED ORDER — ALBUTEROL SULFATE (2.5 MG/3ML) 0.083% IN NEBU: 2.5000 mg | INHALATION_SOLUTION | Freq: Four times a day (QID) | RESPIRATORY_TRACT | Status: DC | PRN

## 2020-01-23 MED ORDER — CARBIDOPA-LEVODOPA ER 50-200 MG PO TBCR
1.0000 | EXTENDED_RELEASE_TABLET | Freq: Three times a day (TID) | ORAL | Status: DC
  Administered 2020-01-23 – 2020-01-26 (×9): 1 via ORAL
  Filled 2020-01-23 (×12): qty 1

## 2020-01-23 MED ORDER — PERFLUTREN LIPID MICROSPHERE
1.0000 mL | INTRAVENOUS | Status: AC | PRN
  Administered 2020-01-23: 2 mL via INTRAVENOUS
  Filled 2020-01-23: qty 10

## 2020-01-23 MED ORDER — GABAPENTIN 400 MG PO CAPS
400.0000 mg | ORAL_CAPSULE | Freq: Two times a day (BID) | ORAL | Status: DC
  Administered 2020-01-23: 400 mg via ORAL
  Filled 2020-01-23: qty 4

## 2020-01-23 MED ORDER — GABAPENTIN 400 MG PO CAPS
400.0000 mg | ORAL_CAPSULE | Freq: Two times a day (BID) | ORAL | Status: DC
  Administered 2020-01-24: 400 mg via ORAL
  Filled 2020-01-23: qty 1

## 2020-01-23 NOTE — Progress Notes (Signed)
Carotid artery duplex completed. Refer to "CV Proc" under chart review to view preliminary results.  01/23/2020 11:43 AM Eula Fried., MHA, RVT, RDCS, RDMS

## 2020-01-23 NOTE — Evaluation (Signed)
Physical Therapy Evaluation Patient Details Name: Sara Raymond MRN: [id number] DOB: 07-14-1950 Today's Date: 01/23/2020   History of Present Illness  Pt is a 69 y/o female admitted secondary to fall and dysarthria. Thought to be from toxic metabolic encephalopathy. PMH includes Parkinson's disease, HTN, asthma, and seizures.   Clinical Impression  Pt admitted secondary to problem above with deficits below. Pt requiring mod- max A to stand and take side steps at EOB. Pt very fearful of falling and held to PT arms. Feel pt would benefit from SNF level therapies prior to return home to increase independence and safety with functional mobility. Will continue to follow acutely.     Follow Up Recommendations SNF;Supervision/Assistance - 24 hour    Equipment Recommendations  Other (comment) (TBD)    Recommendations for Other Services       Precautions / Restrictions Precautions Precautions: Fall Restrictions Weight Bearing Restrictions: No      Mobility  Bed Mobility Overal bed mobility: Needs Assistance Bed Mobility: Supine to Sit;Sit to Supine     Supine to sit: Min assist Sit to supine: Mod assist   General bed mobility comments: Min A for trunk elevation to come to sitting. Required mod A for BLE assist for return to supine on higher stretcher height.   Transfers Overall transfer level: Needs assistance Equipment used: None Transfers: Sit to/from Stand Sit to Stand: Mod assist         General transfer comment: Mod A for lift assist and steadying from higher stretcher height. Pt holding to PT arms for support.   Ambulation/Gait Ambulation/Gait assistance: Mod assist;Max assist           General Gait Details: Took side steps at edge of stretcher and had pt hold to PT arms. Pt with increased difficulty taking steps and required mod to max A for steadying.   Stairs            Wheelchair Mobility    Modified Rankin (Stroke Patients Only)       Balance  Overall balance assessment: Needs assistance Sitting-balance support: No upper extremity supported;Feet supported Sitting balance-Leahy Scale: Fair     Standing balance support: Bilateral upper extremity supported;During functional activity Standing balance-Leahy Scale: Poor Standing balance comment: reliant on UE and external support                              Pertinent Vitals/Pain Pain Assessment: No/denies pain    Home Living Family/patient expects to be discharged to:: Private residence Living Arrangements: Spouse/significant other Available Help at Discharge: Family Type of Home: House Home Access: Stairs to enter Entrance Stairs-Rails: Can reach both Entrance Stairs-Number of Steps: 2 Home Layout: One level Home Equipment: Walker - 2 wheels;Tub bench      Prior Function Level of Independence: Independent with assistive device(s)         Comments: Uses RW      Hand Dominance        Extremity/Trunk Assessment   Upper Extremity Assessment Upper Extremity Assessment: Defer to OT evaluation    Lower Extremity Assessment Lower Extremity Assessment: Generalized weakness    Cervical / Trunk Assessment Cervical / Trunk Assessment: Kyphotic  Communication   Communication: No difficulties  Cognition Arousal/Alertness: Awake/alert Behavior During Therapy: WFL for tasks assessed/performed Overall Cognitive Status: No family/caregiver present to determine baseline cognitive functioning  General Comments      Exercises     Assessment/Plan    PT Assessment Patient needs continued PT services  PT Problem List Decreased strength;Decreased balance;Decreased coordination;Decreased mobility;Decreased knowledge of use of DME;Decreased knowledge of precautions       PT Treatment Interventions Gait training;DME instruction;Functional mobility training;Therapeutic activities;Balance  training;Therapeutic exercise;Patient/family education    PT Goals (Current goals can be found in the Care Plan section)  Acute Rehab PT Goals Patient Stated Goal: to stop falling PT Goal Formulation: With patient Time For Goal Achievement: 02/06/20 Potential to Achieve Goals: Good    Frequency Min 2X/week   Barriers to discharge        Co-evaluation               AM-PAC PT "6 Clicks" Mobility  Outcome Measure Help needed turning from your back to your side while in a flat bed without using bedrails?: A Little Help needed moving from lying on your back to sitting on the side of a flat bed without using bedrails?: A Little Help needed moving to and from a bed to a chair (including a wheelchair)?: A Lot Help needed standing up from a chair using your arms (e.g., wheelchair or bedside chair)?: A Lot Help needed to walk in hospital room?: Total Help needed climbing 3-5 steps with a railing? : Total 6 Click Score: 12    End of Session Equipment Utilized During Treatment: Gait belt Activity Tolerance: Patient tolerated treatment well Patient left: in bed;with call bell/phone within reach (on stretcher ) Nurse Communication: Mobility status PT Visit Diagnosis: Unsteadiness on feet (R26.81);Muscle weakness (generalized) (M62.81);History of falling (Z91.81)    Time: 1211-1225 PT Time Calculation (min) (ACUTE ONLY): 14 min   Charges:   PT Evaluation $PT Eval Moderate Complexity: 1 Mod          Farley Ly, PT, DPT  Acute Rehabilitation Services  Pager: [phone] Office: [phone]   Lehman Prom 01/23/2020, 1:53 PM

## 2020-01-23 NOTE — ED Notes (Signed)
Help get patient cleaned up patient is resting with call bell in reach

## 2020-01-23 NOTE — Progress Notes (Deleted)
NEUROLOGY PROGRESS NOTE  Subjective: Patient stating that she feels very tired.  Exam: Vitals:   01/23/20 0630 01/23/20 0845  BP: 117/60   Pulse: (!) 56   Resp: 13   Temp:    SpO2: 100% 100%    ROS General ROS: Positive for - fatigue Psychological ROS: negative for - behavioral disorder, hallucinations, memory difficulties, mood swings or suicidal ideation Ophthalmic ROS: negative for - blurry vision, double vision, eye pain or loss of vision Respiratory ROS: negative for - cough, hemoptysis, shortness of breath or wheezing Cardiovascular ROS: negative for - chest pain, dyspnea on exertion, edema or irregular heartbeat Gastrointestinal ROS: negative for - abdominal pain, diarrhea, hematemesis, nausea/vomiting or stool incontinence Genito-Urinary ROS: negative for - dysuria, hematuria, incontinence or urinary frequency/urgency Musculoskeletal ROS: Positive for -muscular weakness Neurological ROS: as noted in HPI Dermatological ROS: negative for rash and skin lesion changes        Physical Exam  Constitutional: Appears well-developed and well-nourished.  Psych: Affect appropriate to situation Eyes: No scleral injection HENT: No OP obstrucion Head: Normocephalic.  Cardiovascular: Normal rate and regular rhythm.  Respiratory: Effort normal, non-labored breathing GI: Soft.  No distension. There is no tenderness.  Skin: WDI   Neuro:  Mental Status: Alert, oriented, thought content appropriate.  Speech fluent without evidence of aphasia.  Able to follow only simple step commands and there is a significant latency in following the commands.  I believe this is partly due to her Parkinson's and also partly due to patient's lethargy.  Speech slightly dysarthric cranial Nerves: II:  Visual fields grossly normal,  III,IV, VI: ptosis not present, extra-ocular motions intact bilaterally pupils equal, round, reactive to light and accommodation.  Lateral movement of eyes is  ectatic. V,VII: Slight right facial droop., facial light touch sensation normal bilaterally VIII: hearing normal bilaterally IX,X: Palate rises midline XI: bilateral shoulder shrug XII: midline tongue extension Motor: Right : Upper extremity   5/5    Left:     Upper extremity   5/5  Lower extremity   5/5     Lower extremity   5/5 Significant asterixis noted when arms are held out straight Sensory: Pinprick and light touch intact throughout, bilaterally Deep Tendon Reflexes: 2+ and symmetric throughout Plantars: Right: downgoing   Left: downgoing Cerebellar: normal finger-to-nose   Medications:  Scheduled:   stroke: mapping our early stages of recovery book   Does not apply Once   aspirin EC  81 mg Oral Daily   atenolol  25 mg Oral Daily   Carbidopa-Levodopa ER  1 capsule Oral TID   gabapentin  400 mg Oral BID   insulin aspart  0-15 Units Subcutaneous TID WC   lamoTRIgine  200 mg Oral BID   mometasone-formoterol  2 puff Inhalation BID   Continuous:  sodium chloride 75 mL/hr at 01/22/20 2222    Pertinent Labs/Diagnostics:  CT HEAD WO CONTRAST  Result Date: 01/22/2020  IMPRESSION: 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 Atrophy, chronic microvascular disease. No acute intracranial abnormality. Electronically Signed   By: Charlett Nose M.D.   On: 01/22/2020 16:38   MR BRAIN WO CONTRAST  Result Date: 01/22/2020 IMPRESSION: 1. No acute intracranial abnormality. 2. Multifocal white matter hyperintensity, most commonly due to chronic ischemic microangiopathy. 3. 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 Electronically Signed   By: Deatra Robinson M.D.   On: 01/22/2020 20:25    Sara Morn PA-C [facility] [phone]  Assessment:   69 year old female with past medical  history significant for seizures, Parkinson's disease, essential tremor, hypertension, diabetes mellitus, COPD who presents to the emergency department after daughter noticed last  dysarthria since yesterday as well as patient having multiple falls since 3 weeks.  She was recently started on primidone for essential tremors by her neurologist at [facility].  On examination, it was noted that patient had significant asterixis rather then dysmetria.  Patient underwent MRI brain which was negative for acute stroke, showed chronic white matter disease.  Still suspect this is likely drug related, addition of primidone to already high dose of Neurontin may be causing patient to have increased dizziness and falls.  Patient has improved at this point with the dysmetria but still has significant asterixis.  Suspect this will improve over time.  May need to decrease Neurontin dose down to 300 mg twice daily but will wait to see improvement over time.  Toxic metabolic encephalopathy:   Recommendations Patient on Neurontin 800 mg 3 times daily-reduce dose to 400 twice daily for now-has improved slightly but will need to take time for it to be flushed out of her system. Continue lamotrigine 20 mg twice daily  Continue Rytary 1 capsule 3 times a day Continue metoprolol, hold primidone IV fluids Neurochecks      01/23/2020, 10:31 AM

## 2020-01-23 NOTE — Evaluation (Addendum)
Speech Language Pathology Evaluation Patient Details Name: Cleon Thoma MRN: [id number] DOB: [birth year] Today's Date: 01/23/2020 Time: 1420-1440 SLP Time Calculation (min) (ACUTE ONLY): 20 min  Problem List:  Patient Active Problem List   Diagnosis Date Noted  . Toxic metabolic encephalopathy 01/23/2020  . Ataxia 01/22/2020  . Dysarthria 01/22/2020  . Generalized weakness 01/22/2020  . Acute CVA (cerebrovascular accident) (HCC) 08/23/2018  . TIA (transient ischemic attack) 08/22/2018  . Seizures (HCC) 08/22/2018  . Hypertension 08/22/2018  . 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 CKD (chronic kidney disease), stage III 08/22/2018  . Parkinson disease (HCC) 08/22/2018   Past Medical History:  Past Medical History:  Diagnosis Date  . Asthma   . Diabetes mellitus without complication (HCC)   . Hypertension   . Parkinson disease (HCC)   . Sarcoidosis   . Seizures (HCC)    Past Surgical History:  Past Surgical History:  Procedure Laterality Date  . ABDOMINAL HYSTERECTOMY     HPI:  69yo female admitted 01/22/20 with increased falls and dysarthria. PMH: seizures, Parkinson's disease, asthma, HTN, DM2. MRI = no acute abnormality, chronic infarcts noted.   Assessment / Plan / Recommendation Clinical Impression  The [employer] Mental Status Examination (SLUMS) was administered. Pt scored 16/30, indicating neurocognitive impairment. Points were lost on mental math, thought organization, delayed recall (4/5 words remembered), digit reversal, clock drawing, and auditory attention and recall. CT and MRI indicate no acute abnormality, but did note chronic infarcts.   Recommend 24 hour supervision at DC. If DC'd home, recommend home health ST to maximize pt independence and decrease caregiver burden. BSE deferred as pt passed [employer] swallow screen.    SLP Assessment  SLP Recommendation/Assessment: All further Speech Language Pathology  needs can be addressed in the next venue of care  SLP Visit Diagnosis: Cognitive communication deficit (R41.841)    Follow Up Recommendations  Home health SLP;24 hour supervision/assistance       SLP Evaluation Cognition  Overall Cognitive Status: No family/caregiver present to determine baseline cognitive functioning Arousal/Alertness: Awake/alert Orientation Level: Oriented X4 Attention: Focused;Sustained Focused Attention: Appears intact Sustained Attention: Impaired Sustained Attention Impairment: Verbal basic Memory: Impaired (delayed recall of 4/5 words) Comments: 16/30 on SLUMS       Comprehension  Auditory Comprehension Overall Auditory Comprehension: Appears within functional limits for tasks assessed    Expression Expression Primary Mode of Expression: Verbal Verbal Expression Overall Verbal Expression: Appears within functional limits for tasks assessed   Oral / Motor  Oral Motor/Sensory Function Overall Oral Motor/Sensory Function: Within functional limits Motor Speech Overall Motor Speech: Appears within functional limits for tasks assessed   GO                   Fawaz Borquez B. Murvin Natal, [facility], CCC-SLP Speech Language Pathologist Office: [phone]  Leigh [place] 01/23/2020, 4:47 PM

## 2020-01-23 NOTE — Progress Notes (Signed)
PROGRESS NOTE    Sara Raymond  HYW:[id number] DOB: September 26, 1950 DOA: 01/22/2020 PCP: Angelica Chessman, MD   Brief Narrative: Sara Raymond is a 69 y.o. female with medical history significant for seizures, Parkinson's Disease, asthma, HTN and DMII who presents today with an increased frequency of falling and dysarthria. Sara Raymond's daughter reported to the ER provider that the dysarthria began yesterday and the falling began approximately three weeks ago, with the worst of it occurring over the past couple of days (3 x falls 20 Jan 2020; 1 bad fall yesterday - EMS called for wound care). The ataxia has worsened over the past few days. She refused to be transported by EMS after her fall yesterday when she cut her arm on glass that broke when she fell. She has also been unable to feed herself as of yesterday. Sara Raymond refused to go to the hospital last night, and was requested to go today by the family. She has not experienced these symptoms previously. All medications are being taken as prescribed. No recent seizure activity.  Sara Raymond began taking Primidone 50mg  BID on 05 Aug2021 PM. With concern that this was causing the new onset of falling, the daughter called neurologist today and was given permission to discontinue it.  Sara Raymond continues to have difficulty speaking and has generalized weakness while in the emergency room.  CT the head is negative.  Patient was seen by neurology recommended MRI of the brain, which is negative for any acute abnormality shows chronic infarcts.  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 Neurology  consulted , recommended MRI which is negative for acute  abnormality. MRI of the brain is negative for any acute abnormality,  shows chronic infarcts. Consult physical therapy/Occupational Therapy morning for evaluation. Patient placed on fall precautions. Echocardiogram and carotid duplex completed reports pending.   Dysarthria  Speech therapy consulted to see patient in the morning   Hypertension Continue home medications.  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   Check hemoglobin A1c    Parkinson disease (HCC) Continue Sinemet.  Neurology following    Generalized weakness Consult PT/OT.  Fall precautions   DVT prophylaxis:   SCDS Code Status: Full code Family Communication: Discussed with patient in detail Disposition Plan: Patient is from home. Anticipated DC to home. Anticipated DC to home in 1 to 2 days   Consultants:   Neurology  Procedures: Echocardiogram,  carotid, MRI brain  Antimicrobials:  Anti-infectives (From admission, onward)   None      Subjective: Patient was seen and examined at bedside,  she reports feeling better but she is still complains of generalized weakness.  She underwent carotid duplex, undergoing echocardiogram when examined.  Objective: Vitals:   01/23/20 1428 01/23/20 1458 01/23/20 1528 01/23/20 1558  BP: 117/80 129/65 (!) 145/47 (!) 160/79  Pulse: 63 (!) 56 (!) 59 (!) 59  Resp: (!) 22 13 15 13   Temp:      TempSrc:      SpO2: 100% 100% 100% 98%  Weight:      Height:        Intake/Output Summary (Last  24 hours) at 01/23/2020 1610 Last data filed at 01/23/2020 1144 Gross per 24 hour  Intake 1250.07 ml  Output --  Net 1250.07 ml   Filed Weights   01/22/20 1645  Weight: 83 kg    Examination:  General exam: Appears calm and comfortable  Respiratory system: Clear to auscultation. Respiratory effort normal. Cardiovascular system: S1 & S2 heard, RRR. No JVD, murmurs, rubs, gallops or clicks. No  pedal edema. Gastrointestinal system: Abdomen is nondistended, soft and nontender. No organomegaly or masses felt. Normal bowel sounds heard. Central nervous system: Alert and oriented. No focal neurological deficits. Extremities: No focal weakness.  No leg swelling no cyanosis. Skin: No rashes, lesions or ulcers Psychiatry: Judgement and insight appear normal. Mood & affect appropriate.     Data Reviewed: I have personally reviewed following labs and imaging studies  CBC: Recent Labs  Lab 01/22/20 1655 01/22/20 1703 01/23/20 0708  WBC 3.7*  --   --   NEUTROABS 2.4  --   --   HGB 11.0* 12.2 11.6*  HCT 36.3 36.0 34.0*  MCV 95.5  --   --   PLT 142*  --   --    Basic Metabolic Panel: Recent Labs  Lab 01/22/20 1655 01/22/20 1703 01/23/20 0708  NA 136 136 139  K 4.9 4.6 4.4  CL 96* 95*  --   CO2 31  --   --   GLUCOSE 187* 183*  --   BUN 22 26*  --   CREATININE 1.22* 1.20*  --   CALCIUM 9.0  --   --    GFR: Estimated Creatinine Clearance: 44.5 mL/min (A) (by C-G formula based on SCr of 1.2 mg/dL (H)). Liver Function Tests: Recent Labs  Lab 01/22/20 1655  AST 21  ALT 6  ALKPHOS 79  BILITOT 0.6  PROT 6.4*  ALBUMIN 3.6   No results for input(s): LIPASE, AMYLASE in the last 168 hours. No results for input(s): AMMONIA in the last 168 hours. Coagulation Profile: Recent Labs  Lab 01/22/20 1655  INR 1.0   Cardiac Enzymes: No results for input(s): CKTOTAL, CKMB, CKMBINDEX, TROPONINI in the last 168 hours. BNP (last 3 results) No results for input(s): PROBNP in the last 8760 hours. HbA1C: Recent Labs    01/22/20 2116 01/23/20 0538  HGBA1C 6.4* 6.4*   CBG: Recent Labs  Lab 01/22/20 2139 01/23/20 0838 01/23/20 1247  GLUCAP 128* 120* 115*   Lipid Profile: Recent Labs    01/23/20 0538  CHOL 246*  HDL 50  LDLCALC 171*  TRIG 127  CHOLHDL 4.9   Thyroid Function Tests: No results for input(s): TSH, T4TOTAL, FREET4, T3FREE, THYROIDAB in the last 72  hours. Anemia Panel: No results for input(s): VITAMINB12, FOLATE, FERRITIN, TIBC, IRON, RETICCTPCT in the last 72 hours. Sepsis Labs: No results for input(s): PROCALCITON, LATICACIDVEN in the last 168 hours.  Recent Results (from the past 240 hour(s))  SARS Coronavirus 2 by RT PCR (hospital order, performed in [facility] hospital lab) Nasopharyngeal Nasopharyngeal Swab     Status: None   Collection Time: 01/22/20  6:29 PM   Specimen: Nasopharyngeal Swab  Result Value Ref Range Status   SARS Coronavirus 2 NEGATIVE NEGATIVE Final    Comment: (NOTE) SARS-CoV-2 target nucleic acids are NOT DETECTED.  The SARS-CoV-2 RNA is generally detectable in upper and lower respiratory specimens during the acute phase of infection. The lowest concentration of SARS-CoV-2 viral copies this assay can detect is 250 copies / mL. A negative  result does not preclude SARS-CoV-2 infection and should not be used as the sole basis for treatment or other patient management decisions.  A negative result may occur with improper specimen collection / handling, submission of specimen other than nasopharyngeal swab, presence of viral mutation(s) within the areas targeted by this assay, and inadequate number of viral copies (<250 copies / mL). A negative result must be combined with clinical observations, patient history, and epidemiological information.  Fact Sheet for Patients:   BoilerBrush.com.cy  Fact Sheet for Healthcare Providers: https://pope.com/  This test is not yet approved or  cleared by the [place] FDA and has been authorized for detection and/or diagnosis of SARS-CoV-2 by FDA under an Emergency Use Authorization (EUA).  This EUA will remain in effect (meaning this test can be used) for the duration of the COVID-19 declaration under Section 564(b)(1) of the Act, 21 U.S.C. section 360bbb-3(b)(1), unless the authorization is terminated or revoked  sooner.  Performed at [facility], [address] [place], [place] [address]      Radiology Studies: CT HEAD WO CONTRAST  Result Date: 01/22/2020 CLINICAL DATA:  Fall.  Altered mental status. EXAM: CT HEAD WITHOUT CONTRAST TECHNIQUE: Contiguous axial images were obtained from the base of the skull through the vertex without intravenous contrast. 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 There is atrophy and chronic small vessel disease changes. No acute intracranial abnormality. Specifically, no hemorrhage, hydrocephalus, mass lesion, acute infarction, or significant intracranial injury. Vascular: No hyperdense vessel or unexpected calcification. Skull: No acute calvarial abnormality. 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 Atrophy, chronic microvascular disease. No acute intracranial abnormality. Electronically Signed   By: Charlett Nose M.D.   On: 01/22/2020 16:38   MR BRAIN WO CONTRAST  Result Date: 01/22/2020 CLINICAL DATA:  Slurred speech with multiple falls. History of Parkinson's disease. EXAM: MRI HEAD WITHOUT CONTRAST TECHNIQUE: Multiplanar, multiecho pulse sequences of the brain and surrounding structures were obtained without intravenous contrast. COMPARISON:  None. FINDINGS: Brain: No acute infarct, acute hemorrhage or extra-axial collection. Multifocal white matter hyperintensity, most commonly due to chronic ischemic microangiopathy. 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 No chronic microhemorrhage. Normal midline structures. Vascular: Normal flow voids. Skull and upper cervical spine: Normal marrow signal. Sinuses/Orbits: Negative. Other: None. IMPRESSION: 1. No acute intracranial abnormality. 2. Multifocal white matter hyperintensity, most commonly due to chronic ischemic microangiopathy. 3. 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 Electronically Signed    By: Deatra Robinson M.D.   On: 01/22/2020 20:25   DG Chest Portable 1 View  Result Date: 01/22/2020 CLINICAL DATA:  Fall EXAM: PORTABLE CHEST 1 VIEW COMPARISON:  05/26/2019 FINDINGS: The heart size and mediastinal contours are within normal limits. Both lungs are clear. The visualized skeletal structures are unremarkable. IMPRESSION: Normal study. Electronically Signed   By: Charlett Nose M.D.   On: 01/22/2020 17:33   ECHOCARDIOGRAM COMPLETE  Result Date: 01/23/2020    ECHOCARDIOGRAM REPORT   Patient Name:   Sara Raymond Date of Exam: 01/23/2020 Medical Rec #:  [id number]       Height:       63.0 in Accession #:    [id number]      Weight:       183.0 lb Date of Birth:  September 24, 1950       BSA:          1.862 m Patient Age:    34 years  BP:           156/73 mmHg Patient Gender: F               HR:           62 bpm. Exam Location:  Inpatient Procedure: 2D Echo and Intracardiac Opacification Agent Indications:    TIA 435.9 / G45.9  History:        Patient has prior history of Echocardiogram examinations, most                 recent 08/23/2018. Risk Factors:Hypertension and Diabetes.                 Sarcoidosis Parkinson disease.  Sonographer:    Leta Jungling RDCS Referring Phys: [id number] Carlton Adam  Sonographer Comments: Unable to turn during echocardiogram. IMPRESSIONS  1. Left ventricular ejection fraction, by estimation, is 60 to 65%. The left ventricle has normal function. The left ventricle has no regional wall motion abnormalities. There is moderate asymmetric left ventricular hypertrophy of the basal-septal segment. Left ventricular diastolic parameters are consistent with Grade I diastolic dysfunction (impaired relaxation).  2. Right ventricular systolic function is normal. The right ventricular size is normal. There is mildly elevated pulmonary artery systolic pressure. The estimated right ventricular systolic pressure is 45.0 mmHg.  3. The mitral valve is grossly normal. Mild mitral valve  regurgitation. No evidence of mitral stenosis.  4. The aortic valve is tricuspid. Aortic valve regurgitation is mild. Mild aortic valve sclerosis is present, with no evidence of aortic valve stenosis.  5. The inferior vena cava is normal in size with greater than 50% respiratory variability, suggesting right atrial pressure of 3 mmHg. Comparison(s): No significant change from prior study. Conclusion(s)/Recommendation(s): No intracardiac source of embolism detected on this transthoracic study. A transesophageal echocardiogram is recommended to exclude cardiac source of embolism if clinically indicated. FINDINGS  Left Ventricle: Left ventricular ejection fraction, by estimation, is 60 to 65%. The left ventricle has normal function. The left ventricle has no regional wall motion abnormalities. Definity contrast agent was given IV to delineate the left ventricular  endocardial borders. The left ventricular internal cavity size was normal in size. There is moderate asymmetric left ventricular hypertrophy of the basal-septal segment. Left ventricular diastolic parameters are consistent with Grade I diastolic dysfunction (impaired relaxation). Right Ventricle: The right ventricular size is normal. No increase in right ventricular wall thickness. Right ventricular systolic function is normal. There is mildly elevated pulmonary artery systolic pressure. The tricuspid regurgitant velocity is 3.24  m/s, and with an assumed right atrial pressure of 3 mmHg, the estimated right ventricular systolic pressure is 45.0 mmHg. Left Atrium: Left atrial size was normal in size. Right Atrium: Right atrial size was normal in size. Pericardium: Trivial pericardial effusion is present. Mitral Valve: The mitral valve is grossly normal. Mild mitral valve regurgitation. No evidence of mitral valve stenosis. Tricuspid Valve: The tricuspid valve is grossly normal. Tricuspid valve regurgitation is mild . No evidence of tricuspid stenosis. Aortic  Valve: The aortic valve is tricuspid. . There is mild thickening and mild calcification of the aortic valve. Aortic valve regurgitation is mild. Mild aortic valve sclerosis is present, with no evidence of aortic valve stenosis. There is mild thickening of the aortic valve. There is mild calcification of the aortic valve. Pulmonic Valve: The pulmonic valve was grossly normal. Pulmonic valve regurgitation is trivial. No evidence of pulmonic stenosis. Aorta: The aortic root and ascending aorta are structurally normal, with  no evidence of dilitation. Venous: The inferior vena cava is normal in size with greater than 50% respiratory variability, suggesting right atrial pressure of 3 mmHg. IAS/Shunts: The atrial septum is grossly normal.  LEFT VENTRICLE PLAX 2D LVIDd:         3.50 cm  Diastology LVIDs:         2.20 cm  LV e' lateral:   7.62 cm/s LV PW:         1.30 cm  LV E/e' lateral: 9.1 LV IVS:        1.60 cm  LV e' medial:    5.87 cm/s LVOT diam:     1.90 cm  LV E/e' medial:  11.8 LV SV:         46 LV SV Index:   25 LVOT Area:     2.84 cm  RIGHT VENTRICLE RV S prime:     9.14 cm/s TAPSE (M-mode): 1.9 cm LEFT ATRIUM             Index       RIGHT ATRIUM           Index LA diam:        3.50 cm 1.88 cm/m  RA Area:     16.00 cm LA Vol (A2C):   42.0 ml 22.56 ml/m RA Volume:   38.60 ml  20.73 ml/m LA Vol (A4C):   30.1 ml 16.17 ml/m LA Biplane Vol: 36.8 ml 19.76 ml/m  AORTIC VALVE LVOT Vmax:   76.10 cm/s LVOT Vmean:  45.200 cm/s LVOT VTI:    0.164 m  AORTA Ao Root diam: 3.20 cm MITRAL VALVE               TRICUSPID VALVE MV Area (PHT): 3.85 cm    TR Peak grad:   42.0 mmHg MV Decel Time: 197 msec    TR Vmax:        324.00 cm/s MV E velocity: 69.40 cm/s MV A velocity: 69.40 cm/s  SHUNTS MV E/A ratio:  1.00        Systemic VTI:  0.16 m                            Systemic Diam: 1.90 cm Lennie OdorWesley O'Neal MD Electronically signed by Lennie OdorWesley O'Neal MD Signature Date/Time: 01/23/2020/3:18:15 PM    Final    V[facility] CAROTID (at [facility] and  [facility] only)  Result Date: 01/23/2020 Carotid Arterial Duplex Study Indications:       TIA. Comparison Study:  No prior study Performing Technologist: Gertie FeyMichelle Simonetti MHA, RDMS, RVT, RDCS  Examination Guidelines: A complete evaluation includes B-mode imaging, spectral Doppler, color Doppler, and power Doppler as needed of all accessible portions of each vessel. Bilateral testing is considered an integral part of a complete examination. Limited examinations for reoccurring indications may be performed as noted.  Right Carotid Findings: +----------+--------+-------+--------+--------------------------------+--------+           PSV cm/sEDV    StenosisPlaque Description              Comments                   cm/s                                                    +----------+--------+-------+--------+--------------------------------+--------+  CCA Prox  63      12                                                      +----------+--------+-------+--------+--------------------------------+--------+ CCA Distal50      7                                                       +----------+--------+-------+--------+--------------------------------+--------+ ICA Prox  188     59     40-59%  smooth, heterogenous and                                                  calcific                                 +----------+--------+-------+--------+--------------------------------+--------+ ICA Distal98      31                                                      +----------+--------+-------+--------+--------------------------------+--------+ ECA       63                     smooth, heterogenous and                                                  calcific                                 +----------+--------+-------+--------+--------------------------------+--------+ +----------+--------+-------+--------+-------------------+           PSV cm/sEDV cmsDescribeArm Pressure  (mmHG) +----------+--------+-------+--------+-------------------+ [id number]                                        +----------+--------+-------+--------+-------------------+ +---------+--------+--+--------+--+---------+ VertebralPSV cm/s55EDV cm/s15Antegrade +---------+--------+--+--------+--+---------+  Left Carotid Findings: +----------+-------+-------+--------+---------------------------------+--------+           PSV    EDV    StenosisPlaque Description               Comments           cm/s   cm/s                                                     +----------+-------+-------+--------+---------------------------------+--------+ CCA Prox  99     22                                                       +----------+-------+-------+--------+---------------------------------+--------+  CCA Distal80     21                                                       +----------+-------+-------+--------+---------------------------------+--------+ ICA Prox  59     20             heterogenous, calcific and                                                irregular                                 +----------+-------+-------+--------+---------------------------------+--------+ ICA Distal67     20                                                       +----------+-------+-------+--------+---------------------------------+--------+ ECA       63                                                              +----------+-------+-------+--------+---------------------------------+--------+ +----------+--------+--------+----------------+-------------------+           PSV cm/sEDV cm/sDescribe        Arm Pressure (mmHG) +----------+--------+--------+----------------+-------------------+ [id number]             Multiphasic, WNL                    +----------+--------+--------+----------------+-------------------+ +---------+--------+--+--------+--+---------+  VertebralPSV cm/s48EDV cm/s17Antegrade +---------+--------+--+--------+--+---------+   Summary: Right Carotid: 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 Left Carotid: Velocities in the left ICA are consistent with a 1-39% stenosis. Vertebrals:  Bilateral vertebral arteries demonstrate antegrade flow. Subclavians: Normal flow hemodynamics were seen in bilateral subclavian              arteries. *See table(s) above for measurements and observations.     Preliminary     Scheduled Meds: .  stroke: mapping our early stages of recovery book   Does not apply Once  . aspirin EC  81 mg Oral Daily  . atenolol  25 mg Oral Daily  . carbidopa-levodopa  1 tablet Oral TID  . [START ON 01/24/2020] gabapentin  400 mg Oral BID  . insulin aspart  0-15 Units Subcutaneous TID WC  . lamoTRIgine  200 mg Oral BID  . mometasone-formoterol  2 puff Inhalation BID   Continuous Infusions: . sodium chloride 75 mL/hr at 01/23/20 1146     LOS: 0 days    Time spent: 25 mins.    Cipriano Bunker, MD [facility]   If 7PM-7AM, please contact night-coverage

## 2020-01-23 NOTE — Progress Notes (Signed)
*   Echocardiogram 2D Echocardiogram with definity has been performed.  Leta Jungling M 01/23/2020, 1:53 PM

## 2020-01-23 NOTE — Hospital Course (Signed)
Blood gas 7.3/65.5/135/30

## 2020-01-23 NOTE — ED Notes (Signed)
PT returned from [place]. Pt connected to monitor and Nasal O2 changed from protable tank to wall O2 device.

## 2020-01-24 LAB — BASIC METABOLIC PANEL
Anion gap: 9 (ref 5–15)
BUN: 16 mg/dL (ref 8–23)
CO2: 31 mmol/L (ref 22–32)
Calcium: 8.4 mg/dL — ABNORMAL LOW (ref 8.9–10.3)
Chloride: 95 mmol/L — ABNORMAL LOW (ref 98–111)
Creatinine, Ser: 0.83 mg/dL (ref 0.44–1.00)
GFR calc Af Amer: >60 mL/min (ref >60)
GFR calc non Af Amer: >60 mL/min (ref >60)
Glucose, Bld: 134 mg/dL — ABNORMAL HIGH (ref 70–99)
Potassium: 4 mmol/L (ref 3.5–5.1)
Sodium: 135 mmol/L (ref 135–145)

## 2020-01-24 LAB — CBC
HCT: 36 % (ref 36.0–46.0)
Hemoglobin: 11.1 g/dL — ABNORMAL LOW (ref 12.0–15.0)
MCH: 29.4 pg (ref 26.0–34.0)
MCHC: 30.8 g/dL (ref 30.0–36.0)
MCV: 95.2 fL (ref 80.0–100.0)
Platelets: 139 10*3/uL — ABNORMAL LOW (ref 150–400)
RBC: 3.78 MIL/uL — ABNORMAL LOW (ref 3.87–5.11)
RDW: 13.2 % (ref 11.5–15.5)
WBC: 3.2 10*3/uL — ABNORMAL LOW (ref 4.0–10.5)
nRBC: 0 % (ref 0.0–0.2)

## 2020-01-24 LAB — GLUCOSE, CAPILLARY
Glucose-Capillary: 129 mg/dL — ABNORMAL HIGH (ref 70–99)
Glucose-Capillary: 157 mg/dL — ABNORMAL HIGH (ref 70–99)
Glucose-Capillary: 125 mg/dL — ABNORMAL HIGH (ref 70–99)
Glucose-Capillary: 136 mg/dL — ABNORMAL HIGH (ref 70–99)

## 2020-01-24 MED ORDER — GABAPENTIN 400 MG PO CAPS
800.0000 mg | ORAL_CAPSULE | Freq: Once | ORAL | Status: AC
  Administered 2020-01-24: 800 mg via ORAL
  Filled 2020-01-24: qty 2

## 2020-01-24 MED ORDER — GABAPENTIN 400 MG PO CAPS
800.0000 mg | ORAL_CAPSULE | Freq: Three times a day (TID) | ORAL | Status: DC
  Administered 2020-01-24 – 2020-01-26 (×5): 800 mg via ORAL
  Filled 2020-01-24 (×5): qty 2

## 2020-01-24 MED ORDER — ENOXAPARIN SODIUM 40 MG/0.4ML ~~LOC~~ SOLN
40.0000 mg | SUBCUTANEOUS | Status: DC
  Administered 2020-01-24 – 2020-01-26 (×3): 40 mg via SUBCUTANEOUS
  Filled 2020-01-24 (×3): qty 0.4

## 2020-01-24 NOTE — Progress Notes (Signed)
PROGRESS NOTE    Sara Raymond  BJY:[id number] DOB: 06/03/1951 DOA: 01/22/2020 PCP: Angelica Chessman, MD   Brief Narrative: Sara Raymond is a 69 y.o. female with medical history significant for seizures, Parkinson's Disease, asthma, HTN and DMII who presents today with an increased frequency of falling and dysarthria. Sara Raymond's daughter reported to the ER provider that the dysarthria began yesterday and the falling began approximately three weeks ago, with the worst of it occurring over the past couple of days (3 x falls 20 Jan 2020; 1 bad fall yesterday - EMS called for wound care). The ataxia has worsened over the past few days. She refused to be transported by EMS after her fall yesterday when she cut her arm on glass that broke when she fell. She has also been unable to feed herself as of yesterday. Sara Raymond refused to go to the hospital last night, and was requested to go today by the family. She has not experienced these symptoms previously. All medications are being taken as prescribed. No recent seizure activity.  Sara Raymond began taking Primidone 50mg  BID on 05 Aug2021 PM. With concern that this was causing the new onset of falling, the daughter called neurologist today and was given permission to discontinue it.  Sara Raymond continues to have difficulty speaking and has generalized weakness while in the emergency room.  CT the head is negative.  Patient was seen by neurology recommended MRI of the brain, which is negative for any acute abnormality shows chronic infarcts.  Neurology thinks this could be secondary to addition of primidone to high-dose of Neurontin.  Patient seems to be improving.  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 Neurology  consulted , recommended MRI which is negative for acute abnormality. MRI of the brain is negative for any acute abnormality,  shows chronic infarcts. Neurology thinks this could be secondary to medications. Patient recently started primidone, was taking high-dose gabapentin. Patient shows some improvement in symptoms with discontinuation of primidone. Physical therapy/ occupational therapy recommended a skilled nursing facility for rehab. Patient placed on fall precautions. Echocardiogram: left ventricular EF 60 to 65%. Carotid duplex shows mild stenosis in both ICAs. Neurology recommended outpatient neurology follow-up.   Dysarthria  Speech therapy consulted , recommended outpatient follow-up.  Hypertension Continue home medications.  Monitor blood pressure.  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   Check hemoglobin A1c.  Parkinson disease (HCC) Continue Sinemet.  Neurology following  Generalized weakness Consult PT/OT.  Fall precautions   DVT prophylaxis:   SCDS Code Status: Full code Family Communication: Discussed with patient in detail Disposition Plan: Patient is from home. Anticipated DC to SNF Anticipated DC to SNF in 1 to 2 days. Barriers: Awaiting insurance authorization.   Consultants:   Neurology  Procedures: Echocardiogram,  carotid, MRI brain  Antimicrobials:  Anti-infectives (From admission, onward)   None      Subjective: Patient was seen and examined at bedside,  she reports feeling much better but she is still complains of generalized weakness.   She reports worsening of essential tremors with discontinuation of primidone.  Objective: Vitals:   01/23/20 0865 01/24/20 0309 01/24/20 7846 01/24/20 9629  BP: (!) 155/78 139/83 138/71   Pulse: 63 74 65   Resp: Temp: 97.7 F (36.5 C) 97.8 F (36.6 C) 97.8 F (36.6  C)   TempSrc: Oral Oral    SpO2: 100% 100% 100% 100%  Weight:      Height:        Intake/Output Summary (Last 24 hours) at 01/24/2020 1313 Last data filed at 01/24/2020 1051 Gross per 24 hour  Intake 240 ml  Output 2150 ml  Net -1910 ml   Filed Weights   01/22/20 1645  Weight: 83 kg    Examination:  General exam: Appears calm and comfortable  Respiratory system: Clear to auscultation. Respiratory effort normal. Cardiovascular system: S1 & S2 heard, RRR. No JVD, murmurs, rubs, gallops or clicks. No pedal edema. Gastrointestinal system: Abdomen is nondistended, soft and nontender. No organomegaly or masses felt. Normal bowel sounds heard. Central nervous system: Alert and oriented. No focal neurological deficits. Extremities: No focal weakness.  No leg swelling no cyanosis, essential tremors noted. Skin: No rashes, lesions or ulcers Psychiatry: Judgement and insight appear normal. Mood & affect appropriate.     Data Reviewed: I have personally reviewed following labs and imaging studies  CBC: Recent Labs  Lab 01/22/20 1655 01/22/20 1703 01/23/20 0708 01/24/20 0126  WBC 3.7*  --   --  3.2*  NEUTROABS 2.4  --   --   --   HGB 11.0* 12.2 11.6* 11.1*  HCT 36.3 36.0 34.0* 36.0  MCV 95.5  --   --  95.2  PLT 142*  --   --  139*   Basic Metabolic Panel: Recent Labs  Lab 01/22/20 1655 01/22/20 1703 01/23/20 0708 01/24/20 0126  NA 136 136 139 135  K 4.9 4.6 4.4 4.0  CL 96* 95*  --  95*  CO2 31  --   --  31  GLUCOSE 187* 183*  --  134*  BUN 22 26*  --  16  CREATININE 1.22* 1.20*  --  0.83  CALCIUM 9.0  --   --  8.4*   GFR: Estimated Creatinine Clearance: 64.3 mL/min (by C-G formula based on SCr of 0.83 mg/dL). Liver Function Tests: Recent Labs  Lab 01/22/20 1655  AST 21  ALT 6  ALKPHOS 79  BILITOT 0.6  PROT 6.4*  ALBUMIN 3.6   No results for input(s): LIPASE, AMYLASE in the last 168 hours. No results for input(s): AMMONIA in the last 168  hours. Coagulation Profile: Recent Labs  Lab 01/22/20 1655  INR 1.0   Cardiac Enzymes: No results for input(s): CKTOTAL, CKMB, CKMBINDEX, TROPONINI in the last 168 hours. BNP (last 3 results) No results for input(s): PROBNP in the last 8760 hours. HbA1C: Recent Labs    01/22/20 2116 01/23/20 0538  HGBA1C 6.4* 6.4*   CBG: Recent Labs  Lab 01/23/20 1247 01/23/20 1733 01/23/20 2112 01/24/20 0730 01/24/20 1208  GLUCAP 115* 100* 253* 129* 157*   Lipid Profile: Recent Labs    01/23/20 0538  CHOL 246*  HDL 50  LDLCALC 171*  TRIG 127  CHOLHDL 4.9   Thyroid Function Tests: No results for input(s): TSH, T4TOTAL, FREET4, T3FREE, THYROIDAB in the last 72 hours. Anemia Panel: No results for input(s): VITAMINB12, FOLATE, FERRITIN, TIBC, IRON, RETICCTPCT in the last 72 hours. Sepsis Labs: No results for input(s): PROCALCITON, LATICACIDVEN in the last 168 hours.  Recent Results (from the past 240 hour(s))  SARS Coronavirus 2 by RT PCR (hospital order,  performed in [facility]) Nasopharyngeal Nasopharyngeal Swab     Status: None   Collection Time: 01/22/20  6:29 PM   Specimen: Nasopharyngeal Swab  Result Value Ref Range Status   SARS Coronavirus 2 NEGATIVE NEGATIVE Final    Comment: (NOTE) SARS-CoV-2 target nucleic acids are NOT DETECTED.  The SARS-CoV-2 RNA is generally detectable in upper and lower respiratory specimens during the acute phase of infection. The lowest concentration of SARS-CoV-2 viral copies this assay can detect is 250 copies / mL. A negative result does not preclude SARS-CoV-2 infection and should not be used as the sole basis for treatment or other patient management decisions.  A negative result may occur with improper specimen collection / handling, submission of specimen other than nasopharyngeal swab, presence of viral mutation(s) within the areas targeted by this assay, and inadequate number of viral copies (<250 copies / mL). A  negative result must be combined with clinical observations, patient history, and epidemiological information.  Fact Sheet for Patients:   BoilerBrush.com.cyhttps://www.fda.gov/media/136312/download  Fact Sheet for Healthcare Providers: https://pope.com/https://www.fda.gov/media/136313/download  This test is not yet approved or  cleared by the [place] FDA and has been authorized for detection and/or diagnosis of SARS-CoV-2 by FDA under an Emergency Use Authorization (EUA).  This EUA will remain in effect (meaning this test can be used) for the duration of the COVID-19 declaration under Section 564(b)(1) of the Act, 21 U.S.C. section 360bbb-3(b)(1), unless the authorization is terminated or revoked sooner.  Performed at [facility], [address] [place], [place] [address]      Radiology Studies: CT HEAD WO CONTRAST  Result Date: 01/22/2020 CLINICAL DATA:  Fall.  Altered mental status. EXAM: CT HEAD WITHOUT CONTRAST TECHNIQUE: Contiguous axial images were obtained from the base of the skull through the vertex without intravenous contrast. 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 There is atrophy and chronic small vessel disease changes. No acute intracranial abnormality. Specifically, no hemorrhage, hydrocephalus, mass lesion, acute infarction, or significant intracranial injury. Vascular: No hyperdense vessel or unexpected calcification. Skull: No acute calvarial abnormality. 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 Atrophy, chronic microvascular disease. No acute intracranial abnormality. Electronically Signed   By: Charlett NoseKevin  Dover M.D.   On: 01/22/2020 16:38   MR BRAIN WO CONTRAST  Result Date: 01/22/2020 CLINICAL DATA:  Slurred speech with multiple falls. History of Parkinson's disease. EXAM: MRI HEAD WITHOUT CONTRAST TECHNIQUE: Multiplanar, multiecho pulse sequences of the brain and surrounding  structures were obtained without intravenous contrast. COMPARISON:  None. FINDINGS: Brain: No acute infarct, acute hemorrhage or extra-axial collection. Multifocal white matter hyperintensity, most commonly due to chronic ischemic microangiopathy. 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 No chronic microhemorrhage. Normal midline structures. Vascular: Normal flow voids. Skull and upper cervical spine: Normal marrow signal. Sinuses/Orbits: Negative. Other: None. IMPRESSION: 1. No acute intracranial abnormality. 2. Multifocal white matter hyperintensity, most commonly due to chronic ischemic microangiopathy. 3. 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 Electronically Signed   By: Deatra RobinsonKevin  Herman M.D.   On: 01/22/2020 20:25   DG Chest Portable 1 View  Result Date: 01/22/2020 CLINICAL DATA:  Fall EXAM: PORTABLE CHEST 1 VIEW COMPARISON:  05/26/2019 FINDINGS: The heart size and mediastinal contours are within normal limits. Both lungs are clear. The visualized skeletal structures are unremarkable. IMPRESSION: Normal study. Electronically Signed   By: Charlett NoseKevin  Dover M.D.   On: 01/22/2020 17:33   ECHOCARDIOGRAM COMPLETE  Result  Date: 01/23/2020    ECHOCARDIOGRAM REPORT   Patient Name:   SAACHI ZALE Date of Exam: 01/23/2020 Medical Rec #:  [id number]       Height:       63.0 in Accession #:    [id number]      Weight:       183.0 lb Date of Birth:  03-Nov-1950       BSA:          1.862 m Patient Age:    68 years        BP:           156/73 mmHg Patient Gender: F               HR:           62 bpm. Exam Location:  Inpatient Procedure: 2D Echo and Intracardiac Opacification Agent Indications:    TIA 435.9 / G45.9  History:        Patient has prior history of Echocardiogram examinations, most                 recent 08/23/2018. Risk Factors:Hypertension and Diabetes.                 Sarcoidosis Parkinson disease.  Sonographer:    Leta Jungling RDCS Referring Phys: [id number] Carlton Adam  Sonographer Comments:  Unable to turn during echocardiogram. IMPRESSIONS  1. Left ventricular ejection fraction, by estimation, is 60 to 65%. The left ventricle has normal function. The left ventricle has no regional wall motion abnormalities. There is moderate asymmetric left ventricular hypertrophy of the basal-septal segment. Left ventricular diastolic parameters are consistent with Grade I diastolic dysfunction (impaired relaxation).  2. Right ventricular systolic function is normal. The right ventricular size is normal. There is mildly elevated pulmonary artery systolic pressure. The estimated right ventricular systolic pressure is 45.0 mmHg.  3. The mitral valve is grossly normal. Mild mitral valve regurgitation. No evidence of mitral stenosis.  4. The aortic valve is tricuspid. Aortic valve regurgitation is mild. Mild aortic valve sclerosis is present, with no evidence of aortic valve stenosis.  5. The inferior vena cava is normal in size with greater than 50% respiratory variability, suggesting right atrial pressure of 3 mmHg. Comparison(s): No significant change from prior study. Conclusion(s)/Recommendation(s): No intracardiac source of embolism detected on this transthoracic study. A transesophageal echocardiogram is recommended to exclude cardiac source of embolism if clinically indicated. FINDINGS  Left Ventricle: Left ventricular ejection fraction, by estimation, is 60 to 65%. The left ventricle has normal function. The left ventricle has no regional wall motion abnormalities. Definity contrast agent was given IV to delineate the left ventricular  endocardial borders. The left ventricular internal cavity size was normal in size. There is moderate asymmetric left ventricular hypertrophy of the basal-septal segment. Left ventricular diastolic parameters are consistent with Grade I diastolic dysfunction (impaired relaxation). Right Ventricle: The right ventricular size is normal. No increase in right ventricular wall thickness.  Right ventricular systolic function is normal. There is mildly elevated pulmonary artery systolic pressure. The tricuspid regurgitant velocity is 3.24  m/s, and with an assumed right atrial pressure of 3 mmHg, the estimated right ventricular systolic pressure is 45.0 mmHg. Left Atrium: Left atrial size was normal in size. Right Atrium: Right atrial size was normal in size. Pericardium: Trivial pericardial effusion is present. Mitral Valve: The mitral valve is grossly normal. Mild mitral valve regurgitation. No evidence of mitral valve stenosis. Tricuspid Valve: The tricuspid valve  is grossly normal. Tricuspid valve regurgitation is mild . No evidence of tricuspid stenosis. Aortic Valve: The aortic valve is tricuspid. . There is mild thickening and mild calcification of the aortic valve. Aortic valve regurgitation is mild. Mild aortic valve sclerosis is present, with no evidence of aortic valve stenosis. There is mild thickening of the aortic valve. There is mild calcification of the aortic valve. Pulmonic Valve: The pulmonic valve was grossly normal. Pulmonic valve regurgitation is trivial. No evidence of pulmonic stenosis. Aorta: The aortic root and ascending aorta are structurally normal, with no evidence of dilitation. Venous: The inferior vena cava is normal in size with greater than 50% respiratory variability, suggesting right atrial pressure of 3 mmHg. IAS/Shunts: The atrial septum is grossly normal.  LEFT VENTRICLE PLAX 2D LVIDd:         3.50 cm  Diastology LVIDs:         2.20 cm  LV e' lateral:   7.62 cm/s LV PW:         1.30 cm  LV E/e' lateral: 9.1 LV IVS:        1.60 cm  LV e' medial:    5.87 cm/s LVOT diam:     1.90 cm  LV E/e' medial:  11.8 LV SV:         46 LV SV Index:   25 LVOT Area:     2.84 cm  RIGHT VENTRICLE RV S prime:     9.14 cm/s TAPSE (M-mode): 1.9 cm LEFT ATRIUM             Index       RIGHT ATRIUM           Index LA diam:        3.50 cm 1.88 cm/m  RA Area:     16.00 cm LA Vol (A2C):    42.0 ml 22.56 ml/m RA Volume:   38.60 ml  20.73 ml/m LA Vol (A4C):   30.1 ml 16.17 ml/m LA Biplane Vol: 36.8 ml 19.76 ml/m  AORTIC VALVE LVOT Vmax:   76.10 cm/s LVOT Vmean:  45.200 cm/s LVOT VTI:    0.164 m  AORTA Ao Root diam: 3.20 cm MITRAL VALVE               TRICUSPID VALVE MV Area (PHT): 3.85 cm    TR Peak grad:   42.0 mmHg MV Decel Time: 197 msec    TR Vmax:        324.00 cm/s MV E velocity: 69.40 cm/s MV A velocity: 69.40 cm/s  SHUNTS MV E/A ratio:  1.00        Systemic VTI:  0.16 m                            Systemic Diam: 1.90 cm Lennie Odor MD Electronically signed by Lennie Odor MD Signature Date/Time: 01/23/2020/3:18:15 PM    Final    VAS US CAROTID (at [facility] and [facility] only)  Result Date: 01/23/2020 Carotid Arterial Duplex Study Indications:       TIA. Comparison Study:  No prior study Performing Technologist: Gertie Fey MHA, RDMS, RVT, RDCS  Examination Guidelines: A complete evaluation includes B-mode imaging, spectral Doppler, color Doppler, and power Doppler as needed of all accessible portions of each vessel. Bilateral testing is considered an integral part of a complete examination. Limited examinations for reoccurring indications may be performed as noted.  Right Carotid Findings: +----------+--------+-------+--------+--------------------------------+--------+  PSV cm/sEDV    StenosisPlaque Description              Comments                   cm/s                                                    +----------+--------+-------+--------+--------------------------------+--------+ CCA Prox  63      12                                                      +----------+--------+-------+--------+--------------------------------+--------+ CCA Distal50      7                                                       +----------+--------+-------+--------+--------------------------------+--------+ ICA Prox  188     59     40-59%  smooth, heterogenous and                                                   calcific                                 +----------+--------+-------+--------+--------------------------------+--------+ ICA Distal98      31                                                      +----------+--------+-------+--------+--------------------------------+--------+ ECA       63                     smooth, heterogenous and                                                  calcific                                 +----------+--------+-------+--------+--------------------------------+--------+ +----------+--------+-------+--------+-------------------+           PSV cm/sEDV cmsDescribeArm Pressure (mmHG) +----------+--------+-------+--------+-------------------+ [id number]                                        +----------+--------+-------+--------+-------------------+ +---------+--------+--+--------+--+---------+ VertebralPSV cm/s55EDV cm/s15Antegrade +---------+--------+--+--------+--+---------+  Left Carotid Findings: +----------+-------+-------+--------+---------------------------------+--------+           PSV    EDV    StenosisPlaque Description               Comments  cm/s   cm/s                                                     +----------+-------+-------+--------+---------------------------------+--------+ CCA Prox  99     22                                                       +----------+-------+-------+--------+---------------------------------+--------+ CCA Distal80     21                                                       +----------+-------+-------+--------+---------------------------------+--------+ ICA Prox  59     20             heterogenous, calcific and                                                irregular                                 +----------+-------+-------+--------+---------------------------------+--------+ ICA Distal67     20                                                        +----------+-------+-------+--------+---------------------------------+--------+ ECA       63                                                              +----------+-------+-------+--------+---------------------------------+--------+ +----------+--------+--------+----------------+-------------------+           PSV cm/sEDV cm/sDescribe        Arm Pressure (mmHG) +----------+--------+--------+----------------+-------------------+ [id number]             Multiphasic, WNL                    +----------+--------+--------+----------------+-------------------+ +---------+--------+--+--------+--+---------+ VertebralPSV cm/s48EDV cm/s17Antegrade +---------+--------+--+--------+--+---------+   Summary: Right Carotid: 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 Left Carotid: Velocities in the left ICA are consistent with a 1-39% stenosis. Vertebrals:  Bilateral vertebral arteries demonstrate antegrade flow. Subclavians: Normal flow hemodynamics were seen in bilateral subclavian              arteries. *See table(s) above for measurements and observations.  Electronically signed by Fabienne Bruns MD on 01/23/2020 at 5:18:28 PM.    Final     Scheduled Meds: . aspirin EC  81 mg Oral Daily  .  atenolol  25 mg Oral Daily  . carbidopa-levodopa  1 tablet Oral TID  . enoxaparin (LOVENOX) injection  40 mg Subcutaneous Q24H  . gabapentin  800 mg Oral TID  . insulin aspart  0-15 Units Subcutaneous TID WC  . lamoTRIgine  200 mg Oral BID  . mometasone-formoterol  2 puff Inhalation BID   Continuous Infusions: . sodium chloride 75 mL/hr at 01/24/20 0615     LOS: 1 day    Time spent: 25 mins.    Cipriano Bunker, MD [facility]   If 7PM-7AM, please contact night-coverage

## 2020-01-24 NOTE — Progress Notes (Addendum)
NEUROLOGY PROGRESS NOTE  Subjective: Patient states that she definitely feels more alert however she feels like her tremor slightly worse.  Exam: Vitals:   01/24/20 0729 01/24/20 0824  BP: 138/71   Pulse: 65   Resp: 14   Temp: 97.8 F (36.6 C)   SpO2: 100% 100%    Physical Exam  Constitutional: Appears well-developed and well-nourished.  Eyes: No scleral injection HENT: No OP obstrucion Head: Normocephalic.  Cardiovascular: Palpable Respiratory: Effort normal, non-labored breathing  Skin: WDI   Neuro:  Mental Status: Alert, oriented, thought content appropriate.  It should be noted that patient is much more awake today and interactive.  Speech fluent without evidence of aphasia.  Able to follow 3 step commands without difficulty although bradykinetic secondary to her Parkinson's disease. Cranial Nerves: II:  Visual fields grossly normal,  III,IV, VI: ptosis not present, extra-ocular motions intact bilaterally pupils equal, round, reactive to light and accommodation V,VII: Slight right facial droop, facial light touch sensation normal bilaterally VIII: hearing normal bilaterally Motor: Right : Upper extremity   5/5    Left:     Upper extremity   5/5  Lower extremity   5/5     Lower extremity   5/5 *Status has improved significantly.  Continues to have tremor bilaterally. Sensory: Pinprick and light touch intact throughout, bilaterally Deep Tendon Reflexes: 2+ and symmetric throughout Plantars: Right: downgoing   Left: downgoing Cerebellar: normal finger-to-nose shows no dysmetria however she does have tremor     Medications:  Prior to Admission:  Medications Prior to Admission  Medication Sig Dispense Refill Last Dose  . albuterol (PROAIR HFA) 108 (90 Base) MCG/ACT inhaler Inhale 1-2 puffs into the lungs every 6 (six) hours as needed for wheezing or shortness of breath.   unknown  . aspirin EC 81 MG tablet Take 81 mg by mouth daily. Swallow whole.   01/22/2020 at am   . atenolol (TENORMIN) 25 MG tablet Take 25 mg by mouth daily.   01/22/2020 at 9:30am  . Carbidopa-Levodopa ER (RYTARY) 48.75-195 MG CPCR Take 1 capsule by mouth 3 (three) times daily.   01/22/2020 at am  . Fluticasone-Salmeterol (ADVAIR) 250-50 MCG/DOSE AEPB Inhale 1 puff into the lungs 2 (two) times daily as needed (shortness of breath/wheezing).   unknown  . gabapentin (NEURONTIN) 800 MG tablet Take 800 mg by mouth 3 (three) times daily.   01/22/2020 at am  . lamoTRIgine (LAMICTAL) 200 MG tablet Take 200 mg by mouth 2 (two) times daily.   01/22/2020 at am  . naproxen sodium (ALEVE) 220 MG tablet Take 220-440 mg by mouth 2 (two) times daily as needed (for pain).   unknown  . OXYGEN Inhale 3 L into the lungs continuous.   01/22/2020  . primidone (MYSOLINE) 50 MG tablet Take 50 mg by mouth 2 (two) times daily.   01/22/2020 at am  . atorvastatin (LIPITOR) 80 MG tablet Take 1 tablet (80 mg total) by mouth daily at 6 PM. (Patient not taking: Reported on 01/22/2020) 30 tablet 0 Not Taking at Unknown time  . Carbidopa-Levodopa ER (RYTARY) 61.25-245 MG CPCR Take 1 capsule by mouth 3 (three) times daily.   few days ago   Scheduled: . aspirin EC  81 mg Oral Daily  . atenolol  25 mg Oral Daily  . carbidopa-levodopa  1 tablet Oral TID  . enoxaparin (LOVENOX) injection  40 mg Subcutaneous Q24H  . gabapentin  800 mg Oral TID  . gabapentin  800 mg Oral Once  .  insulin aspart  0-15 Units Subcutaneous TID WC  . lamoTRIgine  200 mg Oral BID  . 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 Atrophy, chronic microvascular disease. No acute intracranial abnormality. Electronically Signed   By: Charlett Nose M.D.   On: 01/22/2020 16:38   MR BRAIN WO CONTRAST  Result Date: 01/22/2020 IMPRESSION: 1. No acute intracranial abnormality. 2. Multifocal white matter  hyperintensity, most commonly due to chronic ischemic microangiopathy. 3. 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 Electronically Signed   By: Deatra Robinson M.D.   On: 01/22/2020 20:25   DG Chest Portable 1 View  Result Date: 01/22/2020 CLINICAL DATA:  Fall EXAM: PORTABLE CHEST 1 VIEW COMPARISON:  05/26/2019 FINDINGS: The heart size and mediastinal contours are within normal limits. Both lungs are clear. The visualized skeletal structures are unremarkable. IMPRESSION: Normal study. Electronically Signed   By: Charlett Nose M.D.   On: 01/22/2020 17:33   ECHOCARDIOGRAM COMPLETE  FINDINGS  Left Ventricle: Left ventricular ejection fraction, by estimation, is 60 to 65%. The left ventricle has normal function. The left ventricle has no regional wall motion abnormalities. VAS US CAROTID (at [facility] and [facility] only)  Result Date: 01/23/2020   Summary: Right Carotid: 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 Left Carotid: Velocities in the left ICA are consistent with a 1-39% stenosis. Vertebrals:  Bilateral vertebral arteries demonstrate antegrade flow. Subclavians: Normal flow hemodynamics were seen in bilateral subclavian              arteries. *See table(s) above for measurements and observations.  Electronically signed by Fabienne Bruns MD on 01/23/2020 at 5:18:28 PM.    Final     Assessment:  69 year old female with past medical history significant for seizures, Parkinson's disease,essential tremor,hypertension, diabetes mellitus,COPDwho presents to the emergency department after daughter noticed last dysarthria since yesterday as well as patient having multiple falls since 3 weeks.She was recently started on primidone for essential tremors by her neurologist at [facility].  On examination,it was noted that patient shows significant improvement in patient's alertness and interaction.  Asterixis has improved..Patient underwent MRI brain which was  negative for acute stroke, showed chronic white matter disease.  Still suspect this is likely drug related, addition of primidone to already high dose of Neurontin may be causing patient to have increased dizziness and falls.  Note Neurontin was held last night (she received only 400 mg morning dose) and as stated above patient's mentation has improved significantly.   Toxic metabolic encephalopathy:  Recommendations -Resume home Neurontin, recommend follow-up with outpatient neurologist whether this can be tapered in favor of primidone versus attempting to use a different agent for tremor control or trying a lower dose of primidone - Continue lamotrigine 20 mg twice daily - ContinueRytary 1 capsule 3 times a day - Continue metoprolol - Do not resume primidone   Felicie Morn PA-C [facility] [phone]  Attending attestation:  Agree with APP note as documented above.  I personally examined the patient who told me that gabapentin had been started for her seizures and that she had been on this high dose for a long time.  On examination her asterixis is markedly improved compared to what has been described previously, though she has  a pill-rolling tremor at baseline that is worse in the right arm and an intention tremor bilaterally (again right greater than left).  She is alert and able to follow complex commands, and is oriented to year, month, place, and situation.   I have put in orders to resume her home dose of gabapentin after we discussed with her family the risks and benefits of this medication.  We will discontinue the primidone, with further titration of medications to be deferred to her outpatient neurologist.  As the patient is improving and I anticipate no further changes in management, I will sign off at this time.  Brooke Dare MD-PhD [facility] [phone]    01/24/2020, 11:37 AM  Addended for charge capture

## 2020-01-24 NOTE — Evaluation (Signed)
Occupational Therapy Evaluation Patient Details Name: Sara Raymond MRN: [id number] DOB: 1951/06/01 Today's Date: 01/24/2020    History of Present Illness Pt is a 69 y/o female admitted secondary to fall and dysarthria. Thought to be from toxic metabolic encephalopathy. PMH includes Parkinson's disease, HTN, asthma, and seizures.    Clinical Impression   PTA, pt was living at home with her husband, pt reports she was independent with ADL/IADL and modified independent with functional mobility at RW level. Pt currently demonstrates generalized weakness, ataxic BUE movements and instability/loss of balance when reaching outside base of support and without BUE support on external surface. Pt requires modA to stand from EOB and minA for functional mobility at RW level with BUE support. Pt on 3lnc throughout session and maintained SpO2 >98%. Pt reports her husband recently had COVID-19 and is weaker since returning home from SNF, pt reports her daughter works during the day. Due to decline in current level of function, pt would benefit from acute OT to address established goals to facilitate safe D/C to venue listed below. At this time, recommend SNF follow-up. Pt initially agreeable, but when therapist was leaving then stated, "I think I want to go home", recommended pt continue to think this over and discuss with her family. Will continue to follow acutely.     Follow Up Recommendations  SNF;Supervision/Assistance - 24 hour    Equipment Recommendations  3 in 1 bedside commode    Recommendations for Other Services       Precautions / Restrictions Precautions Precautions: Fall Restrictions Weight Bearing Restrictions: No      Mobility Bed Mobility Overal bed mobility: Needs Assistance Bed Mobility: Supine to Sit     Supine to sit: Min assist     General bed mobility comments: minA for trunk elevation and to progress to EOB  Transfers Overall transfer level: Needs  assistance Equipment used: Rolling walker (2 wheeled) Transfers: Sit to/from Stand Sit to Stand: Mod assist         General transfer comment: modA to powerup into standing from EOB    Balance Overall balance assessment: Needs assistance Sitting-balance support: No upper extremity supported;Feet supported Sitting balance-Leahy Scale: Fair     Standing balance support: Bilateral upper extremity supported;During functional activity Standing balance-Leahy Scale: Poor Standing balance comment: reliant on BUE support on RW, moderate LOB when reaching outside base of support requiring assistance from therapist to correct                           ADL either performed or assessed with clinical judgement   ADL Overall ADL's : Needs assistance/impaired Eating/Feeding: Set up;Sitting   Grooming: Set up;Sitting   Upper Body Bathing: Set up;Sitting   Lower Body Bathing: Moderate assistance;Sit to/from stand   Upper Body Dressing : Set up;Sitting   Lower Body Dressing: Moderate assistance;Sit to/from stand Lower Body Dressing Details (indicate cue type and reason): able to figure-4;required modA to powerup into standing Toilet Transfer: Minimal assistance;Stand-pivot;RW Toilet Transfer Details (indicate cue type and reason): minA to stand-pivot to recliner Toileting- Clothing Manipulation and Hygiene: Moderate assistance;Sit to/from stand Toileting - Clothing Manipulation Details (indicate cue type and reason): modA for controlled sit<>stand     Functional mobility during ADLs: Minimal assistance;Rolling walker General ADL Comments: minA with BUE support, up to modA without UE support and pt reaching outside base of support, pt with instability and moderate LOB when reaching outside base of support  Vision Baseline Vision/History: Cataracts Patient Visual Report: No change from baseline;Blurring of vision;Other (comment) (pt reports blurry vision after cataract sx)        Perception     Praxis      Pertinent Vitals/Pain Pain Assessment: No/denies pain     Hand Dominance Right   Extremity/Trunk Assessment Upper Extremity Assessment Upper Extremity Assessment: Generalized weakness;LUE deficits/detail;RUE deficits/detail RUE Deficits / Details: resting tremors, pt reports this is at baseline, able to complete ADL and fine motor with increased time and effort;grossly 3+/5 RUE Sensation: WNL RUE Coordination: decreased gross motor;decreased fine motor LUE Deficits / Details: same as RUE;grossly 3+/5 LUE Sensation: WNL LUE Coordination: decreased fine motor;decreased gross motor   Lower Extremity Assessment Lower Extremity Assessment: Generalized weakness   Cervical / Trunk Assessment Cervical / Trunk Assessment: Kyphotic   Communication Communication Communication: No difficulties   Cognition Arousal/Alertness: Awake/alert Behavior During Therapy: WFL for tasks assessed/performed Overall Cognitive Status: No family/caregiver present to determine baseline cognitive functioning                                 General Comments: decreased safety awareness, decreased awareness of deficits;oriented to place;pt requires increased time for processing information    General Comments  vss on 3lnc    Exercises     Shoulder Instructions      Home Living Family/patient expects to be discharged to:: Private residence Living Arrangements: Spouse/significant other Available Help at Discharge: Family Type of Home: House Home Access: Stairs to enter Secretary/administrator of Steps: 2 Entrance Stairs-Rails: Can reach both Home Layout: One level     Bathroom Shower/Tub: Chief Strategy Officer: Standard     Home Equipment: Environmental consultant - 2 wheels;Tub bench   Additional Comments: reports her husband was recently admitted to hospital and SNF due to COVID and is weaker since admission  Lives With: Spouse    Prior  Functioning/Environment Level of Independence: Independent with assistive device(s)        Comments: Uses RW         OT Problem List: Decreased strength;Decreased range of motion;Decreased activity tolerance;Impaired balance (sitting and/or standing);Decreased cognition;Decreased safety awareness;Decreased knowledge of use of DME or AE;Decreased knowledge of precautions      OT Treatment/Interventions: Self-care/ADL training;Therapeutic exercise;Energy conservation;DME and/or AE instruction;Therapeutic activities;Cognitive remediation/compensation;Patient/family education;Balance training    OT Goals(Current goals can be found in the care plan section) Acute Rehab OT Goals Patient Stated Goal: to get stronger and continue to remain independent OT Goal Formulation: With patient Time For Goal Achievement: 02/07/20 Potential to Achieve Goals: Good ADL Goals Pt Will Perform Grooming: standing;with supervision;sitting Pt Will Perform Upper Body Dressing: with supervision;sitting Pt Will Perform Lower Body Dressing: sit to/from stand Pt Will Transfer to Toilet: with supervision;ambulating Additional ADL Goal #1: Pt will demonstrate independence with 3 fall prevention strategies during ADL/IADL and functional mobility.  OT Frequency: Min 2X/week   Barriers to D/C: Decreased caregiver support  daughter works during the day, pt reports husband is not in great health       Co-evaluation              AM-PAC OT "6 Clicks" Daily Activity     Outcome Measure Help from another person eating meals?: A Little Help from another person taking care of personal grooming?: A Little Help from another person toileting, which includes using toliet, bedpan, or urinal?: A Lot Help from another  person bathing (including washing, rinsing, drying)?: A Little Help from another person to put on and taking off regular upper body clothing?: A Little Help from another person to put on and taking off  regular lower body clothing?: A Lot 6 Click Score: 16   End of Session Equipment Utilized During Treatment: Gait belt;Rolling walker;Oxygen Nurse Communication: Mobility status  Activity Tolerance: Patient tolerated treatment well Patient left: in chair;with call bell/phone within reach;with chair alarm set  OT Visit Diagnosis: Other abnormalities of gait and mobility (R26.89);Repeated falls (R29.6);Muscle weakness (generalized) (M62.81);History of falling (Z91.81);Other symptoms and signs involving cognitive function                Time: 4944-9675 OT Time Calculation (min): 21 min Charges:  OT General Charges $OT Visit: 1 Visit OT Evaluation $OT Eval Moderate Complexity: 1 Mod  Laurann Mcmorris OTR/L Acute Rehabilitation Services Office: [phone]   Rebeca Alert 01/24/2020, 10:18 AM

## 2020-01-24 NOTE — NC FL2 (Signed)
[place] MEDICAID FL2 LEVEL OF CARE SCREENING TOOL     IDENTIFICATION  Patient Name: Sara Raymond Birthdate: May 12, 1951 Sex: female Admission Date (Current Location): 01/22/2020  [facility] and [place] Number:  Producer, television/film/video and Address:  [facility] [place], [address] [place], [place] [address]      Provider Number: [id number]  Attending Physician Name and Address:  Cipriano Bunker, MD  Relative Name and Phone Number:  Sharra Cayabyab, spouse, [phone]    Current Level of Care: Hospital Recommended Level of Care: Skilled Nursing Facility Prior Approval Number:    Date Approved/Denied:   PASRR Number:    Discharge Plan: SNF    Current Diagnoses: Patient Active Problem List   Diagnosis Date Noted  . Toxic metabolic encephalopathy 01/23/2020  . Ataxia 01/22/2020  . Dysarthria 01/22/2020  . Generalized weakness 01/22/2020  . Acute CVA (cerebrovascular accident) (HCC) 08/23/2018  . TIA (transient ischemic attack) 08/22/2018  . Seizures (HCC) 08/22/2018  . Hypertension 08/22/2018  . 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 CKD (chronic kidney disease), stage III 08/22/2018  . Parkinson disease (HCC) 08/22/2018    Orientation RESPIRATION BLADDER Height & Weight     Self, Time, Situation, Place  O2 Incontinent Weight: 182 lb 15.7 oz (83 kg) Height:  5\' 3"  (160 cm)  BEHAVIORAL SYMPTOMS/MOOD NEUROLOGICAL BOWEL NUTRITION STATUS      Continent Diet (soft.  See discharge summary)  AMBULATORY STATUS COMMUNICATION OF NEEDS Skin   Extensive Assist Verbally Other (Comment) (ecchymosis)                       Personal Care Assistance Level of Assistance  Bathing, Feeding, Dressing Bathing Assistance: Maximum assistance Feeding assistance: Maximum assistance Dressing Assistance: Maximum assistance     Functional Limitations Info             SPECIAL CARE FACTORS FREQUENCY  PT (By licensed  PT), OT (By licensed OT), Speech therapy     PT Frequency: 5x week OT Frequency: 5x week     Speech Therapy Frequency: 2-3x week      Contractures Contractures Info: Not present    Additional Factors Info  Code Status, Insulin Sliding Scale Code Status Info: full     Insulin Sliding Scale Info: 0-15 units 3x day with meals       Current Medications (01/24/2020):  This is the current hospital active medication list Current Facility-Administered Medications  Medication Dose Route Frequency Provider Last Rate Last Admin  . 0.9 %  sodium chloride infusion   Intravenous Continuous Chotiner, 03/25/2020, MD 75 mL/hr at 01/24/20 0615 New Bag at 01/24/20 0615  . acetaminophen (TYLENOL) tablet 650 mg  650 mg Oral Q4H PRN Chotiner, 03/25/20, MD       Or  . acetaminophen (TYLENOL) 160 MG/5ML solution 650 mg  650 mg Per Tube Q4H PRN Chotiner, Claudean Severance, MD       Or  . acetaminophen (TYLENOL) suppository 650 mg  650 mg Rectal Q4H PRN Chotiner, Claudean Severance, MD      . albuterol (PROVENTIL) (2.5 MG/3ML) 0.083% nebulizer solution 2.5 mg  2.5 mg Nebulization Q6H PRN Hammons, Claudean Severance, RPH      . aspirin EC tablet 81 mg  81 mg Oral Daily Chotiner, Gerhard Munch, MD   81 mg at 01/24/20 0848  . atenolol (TENORMIN) tablet 25 mg  25 mg Oral Daily Chotiner, 03/25/20  S, MD   25 mg at 01/24/20 0846  . carbidopa-levodopa (SINEMET CR) 50-200 MG per tablet controlled release 1 tablet  1 tablet Oral TID Cipriano Bunker, MD   1 tablet at 01/24/20 0846  . enoxaparin (LOVENOX) injection 40 mg  40 mg Subcutaneous Q24H Cipriano Bunker, MD      . gabapentin (NEURONTIN) capsule 400 mg  400 mg Oral BID Bhagat, Srishti L, MD   400 mg at 01/24/20 0846  . insulin aspart (novoLOG) injection 0-15 Units  0-15 Units Subcutaneous TID WC Chotiner, Claudean Severance, MD   2 Units at 01/24/20 0847  . lamoTRIgine (LAMICTAL) tablet 200 mg  200 mg Oral BID Chotiner, Claudean Severance, MD   200 mg at 01/24/20 0846  . mometasone-formoterol (DULERA) 200-5  MCG/ACT inhaler 2 puff  2 puff Inhalation BID Chotiner, Claudean Severance, MD   2 puff at 01/24/20 [phone]  . senna-docusate (Senokot-S) tablet 1 tablet  1 tablet Oral QHS PRN Chotiner, Claudean Severance, MD         Discharge Medications: Please see discharge summary for a list of discharge medications.  Relevant Imaging Results:  Relevant Lab Results:   Additional Information SSN [id number]  Lorri Frederick, LCSW

## 2020-01-24 NOTE — Progress Notes (Signed)
NEUROLOGY PROGRESS NOTE ° °Subjective: °Patient stating that she feels very tired. ° °Exam: °Vitals:  ° 01/23/20 0630 01/23/20 0845  °BP: 117/60   °Pulse: (!) 56   °Resp: 13   °Temp:    °SpO2: 100% 100%  ° ° °ROS °General ROS: Positive for - fatigue °Psychological ROS: negative for - behavioral disorder, hallucinations, memory difficulties, mood swings or suicidal ideation °Ophthalmic ROS: negative for - blurry vision, double vision, eye pain or loss of vision °Respiratory ROS: negative for - cough, hemoptysis, shortness of breath or wheezing °Cardiovascular ROS: negative for - chest pain, dyspnea on exertion, edema or irregular heartbeat °Gastrointestinal ROS: negative for - abdominal pain, diarrhea, hematemesis, nausea/vomiting or stool incontinence °Genito-Urinary ROS: negative for - dysuria, hematuria, incontinence or urinary frequency/urgency °Musculoskeletal ROS: Positive for -muscular weakness °Neurological ROS: as noted in HPI °Dermatological ROS: negative for rash and skin lesion changes °  ° ° ° ° ° °Physical Exam  °Constitutional: Appears well-developed and well-nourished.  °Psych: Affect appropriate to situation °Eyes: No scleral injection °HENT: No OP obstrucion °Head: Normocephalic.  °Cardiovascular: Normal rate and regular rhythm.  °Respiratory: Effort normal, non-labored breathing °GI: Soft.  No distension. There is no tenderness.  °Skin: WDI ° ° °Neuro:  °Mental Status: °Alert, oriented, thought content appropriate.  Speech fluent without evidence of aphasia.  Able to follow only simple step commands and there is a significant latency in following the commands.  I believe this is partly due to her Parkinson's and also partly due to patient's lethargy.  Speech slightly dysarthric °cranial Nerves: °II:  Visual fields grossly normal,  °III,IV, VI: ptosis not present, extra-ocular motions intact bilaterally pupils equal, round, reactive to light and accommodation.  Lateral movement of eyes is  ectatic. °V,VII: Slight right facial droop., facial light touch sensation normal bilaterally °VIII: hearing normal bilaterally °IX,X: Palate rises midline °XI: bilateral shoulder shrug °XII: midline tongue extension °Motor: °Right : Upper extremity   5/5    Left:     Upper extremity   5/5 ° Lower extremity   5/5     Lower extremity   5/5 °Significant asterixis noted when arms are held out straight °Sensory: Pinprick and light touch intact throughout, bilaterally °Deep Tendon Reflexes: 2+ and symmetric throughout °Plantars: °Right: downgoing   Left: downgoing °Cerebellar: °normal finger-to-nose ° ° °Medications:  °Scheduled: °•  stroke: mapping our early stages of recovery book   Does not apply Once  °• aspirin EC  81 mg Oral Daily  °• atenolol  25 mg Oral Daily  °• Carbidopa-Levodopa ER  1 capsule Oral TID  °• gabapentin  400 mg Oral BID  °• insulin aspart  0-15 Units Subcutaneous TID WC  °• lamoTRIgine  200 mg Oral BID  °• mometasone-formoterol  2 puff Inhalation BID  ° °Continuous: °• sodium chloride 75 mL/hr at 01/22/20 2222  ° ° °Pertinent Labs/Diagnostics: ° °CT HEAD WO CONTRAST ° °Result Date: 01/22/2020 ° IMPRESSION: 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 Atrophy, chronic microvascular disease. No acute intracranial abnormality. Electronically Signed   By: Kevin  Dover M.D.   On: 01/22/2020 16:38  ° °MR BRAIN WO CONTRAST ° °Result Date: 01/22/2020 °IMPRESSION: 1. No acute intracranial abnormality. 2. Multifocal white matter hyperintensity, most commonly due to chronic ischemic microangiopathy. 3. 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 Electronically Signed   By: Kevin  Herman M.D.   On: 01/22/2020 20:25  ° ° °Levonte Molina PA-C °[facility] °[phone] ° °Assessment:  ° °68-year-old female with past medical   history significant for seizures, Parkinson's disease, essential tremor, hypertension, diabetes mellitus, COPD who presents to the emergency department after daughter noticed last  dysarthria since yesterday as well as patient having multiple falls since 3 weeks.  She was recently started on primidone for essential tremors by her neurologist at [facility]. °  °On examination, it was noted that patient had significant asterixis rather then dysmetria.  Patient underwent MRI brain which was negative for acute stroke, showed chronic white matter disease. °  °Still suspect this is likely drug related, addition of primidone to already high dose of Neurontin may be causing patient to have increased dizziness and falls.  Patient has improved at this point with the dysmetria but still has significant asterixis.  Suspect this will improve over time.  May need to decrease Neurontin dose down to 300 mg twice daily but will wait to see improvement over time. °  °Toxic metabolic encephalopathy:  °  °Recommendations °Patient on Neurontin 800 mg 3 times daily-reduce dose to 400 twice daily for now-has improved slightly but will need to take time for it to be flushed out of her system. °Continue lamotrigine 20 mg twice daily  °Continue Rytary 1 capsule 3 times a day °Continue metoprolol, hold primidone °IV fluids °Neurochecks ° ° ° ° ° °01/23/2020, 10:31 AM ° ° °

## 2020-01-24 NOTE — Progress Notes (Signed)
RE:         Date of Birth:       Date:          To Whom It May Concern:  Please be advised that the above-named patient will require a short-term nursing home stay - anticipated 30 days or less for rehabilitation and strengthening.  The plan is for return home.                 MD signature                Date   RE:         Date of Birth:       Date:          To Whom It May Concern:  Please be advised that the above-named patient has a primary diagnosis of dementia which supersedes any psychiatric diagnosis.                 MD signature                Date

## 2020-01-24 NOTE — TOC Initial Note (Signed)
Transition of Care [facility]) - Initial/Assessment Note    Patient Details  Name: Sara Raymond MRN: [id number] Date of Birth: 05/10/1951  [facility]) CM/SW Contact:    Joanne Chars, LCSW Phone Number: 01/24/2020, 11:16 AM  Clinical Narrative:    CSW met with pt to discuss SNF recommendation.  Pt is willing to go to SNF, has not been before but reports husband was at a facility in [place] that they liked.  Choice provided and discussed. Pt reports she has been vaccinated.                 Expected Discharge Plan: Skilled Nursing Facility Barriers to Discharge: SNF Pending bed offer   Patient Goals and CMS Choice Patient states their goals for this hospitalization and ongoing recovery are:: walk better CMS Medicare.gov Compare Post Acute Care list provided to:: Patient Choice offered to / list presented to : Patient  Expected Discharge Plan and Services Expected Discharge Plan: [place] Choice: [place] arrangements for the past 2 months: Single Family Home                                      Prior Living Arrangements/Services Living arrangements for the past 2 months: Single Family Home Lives with:: Spouse Patient language and need for interpreter reviewed:: Yes Do you feel safe going back to the place where you live?: Yes      Need for Family Participation in Patient Care: Yes (Comment) Care giver support system in place?: Yes (comment)   Criminal Activity/Legal Involvement Pertinent to Current Situation/Hospitalization: No - Comment as needed  Activities of Daily Living      Permission Sought/Granted Permission sought to share information with : Facility Sport and exercise psychologist, Family Supports    Share Information with NAME: Gwenlyn Perking, husband; Becky Sax, daughter  Permission granted to share info w AGENCY: SNF        Emotional Assessment Appearance:: Appears stated  age Attitude/Demeanor/Rapport: Engaged Affect (typically observed): Pleasant Orientation: : Oriented to Self, Oriented to Place, Oriented to  Time, Oriented to Situation Alcohol / Substance Use: Not Applicable Psych Involvement: No (comment)  Admission diagnosis:  Ataxia [R27.0] Left-sided weakness [Q94.7] Toxic metabolic encephalopathy [M54] Fall, initial encounter [W19.XXXA] Difficulty with speech [R47.9] Patient Active Problem List   Diagnosis Date Noted  . Toxic metabolic encephalopathy 65/08/5463  . Ataxia 01/22/2020  . Dysarthria 01/22/2020  . Generalized weakness 01/22/2020  . Acute CVA (cerebrovascular accident) ([place]) 08/23/2018  . TIA (transient ischemic attack) 08/22/2018  . Seizures ([place]) 08/22/2018  . Hypertension 08/22/2018  . 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 CKD (chronic kidney disease), stage III 08/22/2018  . Parkinson disease ([place]) 08/22/2018   PCP:  Robyne Peers, MD Pharmacy:   [facility] [place], [address] [place] [address] Phone: [phone] Fax: [phone]  [facility] - [address], [address] [place] [address] Phone: [phone] Fax: [phone]     Social Determinants of Health (SDOH) Interventions    Readmission Risk Interventions No flowsheet data found.

## 2020-01-25 LAB — CBC
HCT: 36.2 % (ref 36.0–46.0)
Hemoglobin: 10.8 g/dL — ABNORMAL LOW (ref 12.0–15.0)
MCH: 28.7 pg (ref 26.0–34.0)
MCHC: 29.8 g/dL — ABNORMAL LOW (ref 30.0–36.0)
MCV: 96.3 fL (ref 80.0–100.0)
Platelets: 133 10*3/uL — ABNORMAL LOW (ref 150–400)
RBC: 3.76 MIL/uL — ABNORMAL LOW (ref 3.87–5.11)
RDW: 13.2 % (ref 11.5–15.5)
WBC: 3 10*3/uL — ABNORMAL LOW (ref 4.0–10.5)
nRBC: 0 % (ref 0.0–0.2)

## 2020-01-25 LAB — GLUCOSE, CAPILLARY
Glucose-Capillary: 103 mg/dL — ABNORMAL HIGH (ref 70–99)
Glucose-Capillary: 193 mg/dL — ABNORMAL HIGH (ref 70–99)
Glucose-Capillary: 121 mg/dL — ABNORMAL HIGH (ref 70–99)
Glucose-Capillary: 209 mg/dL — ABNORMAL HIGH (ref 70–99)

## 2020-01-25 LAB — BASIC METABOLIC PANEL
Anion gap: 9 (ref 5–15)
BUN: 10 mg/dL (ref 8–23)
CO2: 32 mmol/L (ref 22–32)
Calcium: 8.4 mg/dL — ABNORMAL LOW (ref 8.9–10.3)
Chloride: 96 mmol/L — ABNORMAL LOW (ref 98–111)
Creatinine, Ser: 0.85 mg/dL (ref 0.44–1.00)
GFR calc Af Amer: >60 mL/min (ref >60)
GFR calc non Af Amer: >60 mL/min (ref >60)
Glucose, Bld: 136 mg/dL — ABNORMAL HIGH (ref 70–99)
Potassium: 4.7 mmol/L (ref 3.5–5.1)
Sodium: 137 mmol/L (ref 135–145)

## 2020-01-25 LAB — LAMOTRIGINE LEVEL: Lamotrigine Lvl: 18.5 ug/mL (ref 2.0–20.0)

## 2020-01-25 NOTE — Progress Notes (Signed)
PROGRESS NOTE    Sara Raymond  OAC:[id number] DOB: Oct 03, 1950 DOA: 01/22/2020 PCP: Angelica Chessman, MD   Brief Narrative: Sara Raymond is a 69 y.o. female with medical history significant for seizures, Parkinson's Disease, asthma, HTN and DMII who presents today with an increased frequency of falling and dysarthria. Sara Raymond's daughter reported to the ER provider that the dysarthria began yesterday and the falling began approximately three weeks ago, with the worst of it occurring over the past couple of days (3 x falls 20 Jan 2020; 1 bad fall yesterday - EMS called for wound care). The ataxia has worsened over the past few days. She refused to be transported by EMS after her fall yesterday when she cut her arm on glass that broke when she fell. She has also been unable to feed herself as of yesterday. Sara Raymond refused to go to the hospital last night, and was requested to go today by the family. She has not experienced these symptoms previously. All medications are being taken as prescribed. No recent seizure activity.  Sara Raymond began taking Primidone 50mg  BID on 05 Aug2021 PM. With concern that this was causing the new onset of falling, the daughter called neurologist today and was given permission to discontinue it.  Sara Raymond continues to have difficulty speaking and has generalized weakness while in the emergency room.  CT the head is negative.  Patient was seen by neurology recommended MRI of the brain, which is negative for any acute abnormality shows chronic infarcts.  Neurology thinks this could be secondary to addition of primidone to high-dose of Neurontin.  Patient seems to be improving.  Neurology signed off.  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 Neurology  consulted , recommended MRI which is negative for acute abnormality. MRI of the brain is negative for any acute abnormality,  shows chronic infarcts. Neurology thinks this could be secondary to medications. Patient recently started primidone, was taking high-dose gabapentin. Patient shows some improvement in symptoms with discontinuation of primidone. Physical therapy/ occupational therapy recommended a skilled nursing facility for rehab. Patient placed on fall precautions. Echocardiogram: left ventricular EF 60 to 65%. Carotid duplex shows mild stenosis in both ICAs. Neurology recommended outpatient neurology follow-up. Neurology signed off. Patient is awaiting placement in a skilled nursing facility pending authorization   Dysarthria  Speech therapy consulted , recommended outpatient follow-up.  Hypertension Continue home medications.  Monitor blood pressure.  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   Check hemoglobin A1c.  Parkinson disease (HCC) Continue Sinemet.  Neurology following  Generalized weakness Consult PT/OT.  Fall precautions   DVT prophylaxis:   SCDS Code Status: Full code Family Communication: Discussed with patient in detail. Disposition Plan: Patient is from home. Anticipated DC to SNF Anticipated DC to SNF in 1 to 2 days. Barriers: Awaiting insurance authorization.   Consultants:   Neurology  Procedures: Echocardiogram,  carotid, MRI brain  Antimicrobials:  Anti-infectives (From admission, onward)   None      Subjective: Patient was seen and examined at bedside,  she reports generalized weakness but slowly improving. She  appears slightly confused but alert and oriented,  follows commands.  Objective: Vitals:   01/24/20 1613  01/24/20 2038 01/24/20 2212 01/25/20 0737  BP: (!) 139/96  (!) 144/77 133/88  Pulse: 72  65 70  Resp: 16   16  Temp: 98.1 F (36.7 C)  97.9 F (36.6 C) 97.8 F (36.6 C)  TempSrc:      SpO2: 100% 98% 100% 100%  Weight:      Height:       No intake or output data in the 24 hours ending 01/25/20 1433 Filed Weights   01/22/20 1645  Weight: 83 kg    Examination:  General exam: Appears calm and comfortable  Respiratory system: Clear to auscultation. Respiratory effort normal. Cardiovascular system: S1 & S2 heard, RRR. No JVD, murmurs, rubs, gallops or clicks. No pedal edema. Gastrointestinal system: Abdomen is nondistended, soft and nontender. No organomegaly or masses felt. Normal bowel sounds heard. Central nervous system: Alert and oriented. No focal neurological deficits. Extremities: No focal weakness.  No leg swelling no cyanosis, essential tremors noted. Skin: No rashes, lesions or ulcers Psychiatry: Judgement and insight appear normal. Mood & affect appropriate.     Data Reviewed: I have personally reviewed following labs and imaging studies  CBC: Recent Labs  Lab 01/22/20 1655 01/22/20 1703 01/23/20 0708 01/24/20 0126 01/25/20 0339  WBC 3.7*  --   --  3.2* 3.0*  NEUTROABS 2.4  --   --   --   --   HGB 11.0* 12.2 11.6* 11.1* 10.8*  HCT 36.3 36.0 34.0* 36.0 36.2  MCV 95.5  --   --  95.2 96.3  PLT 142*  --   --  139* 133*   Basic Metabolic Panel: Recent Labs  Lab 01/22/20 1655 01/22/20 1703 01/23/20 0708 01/24/20 0126 01/25/20 0339  NA 136 136 139 135 137  K 4.9 4.6 4.4 4.0 4.7  CL 96* 95*  --  95* 96*  CO2 31  --   --  31 32  GLUCOSE 187* 183*  --  134* 136*  BUN 22 26*  --  16 10  CREATININE 1.22* 1.20*  --  0.83 0.85  CALCIUM 9.0  --   --  8.4* 8.4*   GFR: Estimated Creatinine Clearance: 62.8 mL/min (by C-G formula based on SCr of 0.85 mg/dL). Liver Function Tests: Recent Labs  Lab 01/22/20 1655  AST 21  ALT 6  ALKPHOS 79  BILITOT 0.6   PROT 6.4*  ALBUMIN 3.6   No results for input(s): LIPASE, AMYLASE in the last 168 hours. No results for input(s): AMMONIA in the last 168 hours. Coagulation Profile: Recent Labs  Lab 01/22/20 1655  INR 1.0   Cardiac Enzymes: No results for input(s): CKTOTAL, CKMB, CKMBINDEX, TROPONINI in the last 168 hours. BNP (last 3 results) No results for input(s): PROBNP in the last 8760 hours. HbA1C: Recent Labs    01/22/20 2116 01/23/20 0538  HGBA1C 6.4* 6.4*   CBG: Recent Labs  Lab 01/24/20 1208 01/24/20 1634 01/24/20 2107 01/25/20 0738 01/25/20 1130  GLUCAP 157* 125* 136* 103* 193*   Lipid Profile: Recent Labs    01/23/20 0538  CHOL 246*  HDL 50  LDLCALC 171*  TRIG 127  CHOLHDL 4.9   Thyroid Function Tests: No results for input(s): TSH, T4TOTAL, FREET4, T3FREE, THYROIDAB in the last 72 hours. Anemia Panel: No results for input(s): VITAMINB12, FOLATE, FERRITIN, TIBC, IRON, RETICCTPCT in the last 72 hours. Sepsis Labs: No results for input(s):  PROCALCITON, LATICACIDVEN in the last 168 hours.  Recent Results (from the past 240 hour(s))  SARS Coronavirus 2 by RT PCR (hospital order, performed in [facility] hospital lab) Nasopharyngeal Nasopharyngeal Swab     Status: None   Collection Time: 01/22/20  6:29 PM   Specimen: Nasopharyngeal Swab  Result Value Ref Range Status   SARS Coronavirus 2 NEGATIVE NEGATIVE Final    Comment: (NOTE) SARS-CoV-2 target nucleic acids are NOT DETECTED.  The SARS-CoV-2 RNA is generally detectable in upper and lower respiratory specimens during the acute phase of infection. The lowest concentration of SARS-CoV-2 viral copies this assay can detect is 250 copies / mL. A negative result does not preclude SARS-CoV-2 infection and should not be used as the sole basis for treatment or other patient management decisions.  A negative result may occur with improper specimen collection / handling, submission of specimen other than  nasopharyngeal swab, presence of viral mutation(s) within the areas targeted by this assay, and inadequate number of viral copies (<250 copies / mL). A negative result must be combined with clinical observations, patient history, and epidemiological information.  Fact Sheet for Patients:   BoilerBrush.com.cy  Fact Sheet for Healthcare Providers: https://pope.com/  This test is not yet approved or  cleared by the [place] FDA and has been authorized for detection and/or diagnosis of SARS-CoV-2 by FDA under an Emergency Use Authorization (EUA).  This EUA will remain in effect (meaning this test can be used) for the duration of the COVID-19 declaration under Section 564(b)(1) of the Act, 21 U.S.C. section 360bbb-3(b)(1), unless the authorization is terminated or revoked sooner.  Performed at [facility], [address], [place], [place] [address]      Radiology Studies: No results found.  Scheduled Meds: . aspirin EC  81 mg Oral Daily  . atenolol  25 mg Oral Daily  . carbidopa-levodopa  1 tablet Oral TID  . enoxaparin (LOVENOX) injection  40 mg Subcutaneous Q24H  . gabapentin  800 mg Oral TID  . insulin aspart  0-15 Units Subcutaneous TID WC  . lamoTRIgine  200 mg Oral BID  . mometasone-formoterol  2 puff Inhalation BID   Continuous Infusions: . sodium chloride 75 mL/hr at 01/25/20 1201     LOS: 2 days    Time spent: 25 mins.    Cipriano Bunker, MD [facility]   If 7PM-7AM, please contact night-coverage

## 2020-01-26 LAB — BASIC METABOLIC PANEL
Anion gap: 9 (ref 5–15)
BUN: 11 mg/dL (ref 8–23)
CO2: 30 mmol/L (ref 22–32)
Calcium: 8.5 mg/dL — ABNORMAL LOW (ref 8.9–10.3)
Chloride: 99 mmol/L (ref 98–111)
Creatinine, Ser: 0.83 mg/dL (ref 0.44–1.00)
GFR calc Af Amer: >60 mL/min (ref >60)
GFR calc non Af Amer: >60 mL/min (ref >60)
Glucose, Bld: 131 mg/dL — ABNORMAL HIGH (ref 70–99)
Potassium: 4.4 mmol/L (ref 3.5–5.1)
Sodium: 138 mmol/L (ref 135–145)

## 2020-01-26 LAB — CBC
HCT: 35 % — ABNORMAL LOW (ref 36.0–46.0)
Hemoglobin: 11.3 g/dL — ABNORMAL LOW (ref 12.0–15.0)
MCH: 29.7 pg (ref 26.0–34.0)
MCHC: 32.3 g/dL (ref 30.0–36.0)
MCV: 92.1 fL (ref 80.0–100.0)
Platelets: 137 10*3/uL — ABNORMAL LOW (ref 150–400)
RBC: 3.8 MIL/uL — ABNORMAL LOW (ref 3.87–5.11)
RDW: 13.3 % (ref 11.5–15.5)
WBC: 3.3 10*3/uL — ABNORMAL LOW (ref 4.0–10.5)
nRBC: 0 % (ref 0.0–0.2)

## 2020-01-26 LAB — GLUCOSE, CAPILLARY
Glucose-Capillary: 127 mg/dL — ABNORMAL HIGH (ref 70–99)
Glucose-Capillary: 248 mg/dL — ABNORMAL HIGH (ref 70–99)
Glucose-Capillary: 179 mg/dL — ABNORMAL HIGH (ref 70–99)
Glucose-Capillary: 134 mg/dL — ABNORMAL HIGH (ref 70–99)

## 2020-01-26 NOTE — TOC Progression Note (Addendum)
Transition of Care [facility]) - Progression Note    Patient Details  Name: Lakya Schrupp MRN: [id number] Date of Birth: 06-Oct-1950  [facility]) CM/SW Contact  Lorri Frederick, LCSW Phone Number: 01/26/2020, 11:00 AM  Clinical Narrative:   Pt received multiple bed offers, family chose [facility] completed: [id number], reviewer Kyla Balzarine 3 days starting 8/11 with review on 8/13. Fax clinicals to [phone].  1030: phone call from [facility] that one pt has tested positive for covid, they are in process of testing the whole building.  CSW spoke with pt daughter Lissa Hoard.  She will wait for more information on the extent but doesn't think she wants to send her there with Covid in the building.  CSW went over other bed offers and she will look at options, possibly [facility] care.  1300 Update from [place] at [facility].  Only one pt was positive with Covid, no staff, still need to test 2nd shift staff but seems to be contained. 1300: Kitty at [facility] makes bed offer.  They are cleared for restarting admissions but do have covid positive patients still in the building. 1310: CSW left message with daughter with these updates.  1345: Daughter Lissa Hoard calls back, has spoken with pt husband, they would like to proceed with [facility] admission.     Expected Discharge Plan: Skilled Nursing Facility Barriers to Discharge: SNF Pending bed offer  Expected Discharge Plan and Services Expected Discharge Plan: Skilled Nursing Facility     Post Acute Care Choice: Skilled Nursing Facility Living arrangements for the past 2 months: Single Family Home                                       Social Determinants of Health (SDOH) Interventions    Readmission Risk Interventions No flowsheet data found.

## 2020-01-26 NOTE — Progress Notes (Signed)
Physical Therapy Treatment Patient Details Name: Sara Raymond MRN: [id number] DOB: 1950-09-21 Today's Date: 01/26/2020    History of Present Illness Pt is a 69 y/o female admitted secondary to fall and dysarthria. Acute metabolic encephalopathy. PMH includes Parkinson's disease, HTN, DM, CVA, CKD, asthma, and seizures.    PT Comments    Patient was laying in bed asleep at start of session. A&Ox3 and was able to follow simple commands consistently with increased time. Pt required ModA for supine <> sit with verbal and tactile cueing provided. Needed assistance initiating movements such as moving legs off EOB and moving hand to railing. Sat EOB for several minutes, needed assistance maintaining seated balance with both UE and LE support. MaxAx2 for sit <> stand w/ RW with verbal and tactile cues for hand placement. When standing, patient required physical assistance to remain steady and was having tremors while standing. Attempted to march in place but was not able to lift her legs up. Transferred back to sit EOB, pt reported slight dizziness. Sit to supine TotalAx2 to move legs and trunk back onto bed. She was left laying in bed with all needs within reach and bed alarm set.      Follow Up Recommendations  SNF;Supervision/Assistance - 24 hour     Equipment Recommendations  Rolling walker with 5" wheels    Recommendations for Other Services       Precautions / Restrictions Precautions Precautions: Fall Restrictions Weight Bearing Restrictions: No    Mobility  Bed Mobility Overal bed mobility: Needs Assistance Bed Mobility: Supine to Sit;Sit to Supine     Supine to sit: Mod assist Sit to supine: Mod assist   General bed mobility comments: modA for trunk elevation and to progress to EOB  Transfers Overall transfer level: Needs assistance Equipment used: Rolling walker (2 wheeled) Transfers: Sit to/from Stand Sit to Stand: Max assist;+2 physical assistance          General transfer comment: maxA x2 to stand at EOB with RW. verbal and tactile cues to push up from bed and grab RW  Ambulation/Gait             General Gait Details: attempted to stand in place but was not able to.   Stairs             Wheelchair Mobility    Modified Rankin (Stroke Patients Only)       Balance Overall balance assessment: Needs assistance Sitting-balance support: Feet supported;Bilateral upper extremity supported Sitting balance-Leahy Scale: Poor Sitting balance - Comments: Requied min-modA to keep upright posture   Standing balance support: Bilateral upper extremity supported Standing balance-Leahy Scale: Poor Standing balance comment: reliant on BUE support on RW, tremors while standing, needed support to maintain balance                            Cognition Arousal/Alertness: Awake/alert Behavior During Therapy: WFL for tasks assessed/performed Overall Cognitive Status: No family/caregiver present to determine baseline cognitive functioning                                 General Comments: decreased safety awareness, decreased awareness of deficits; oriented to place and month of year, pt requires increased time for processing information      Exercises      General Comments        Pertinent Vitals/Pain Pain Assessment: No/denies pain  Home Living                      Prior Function            PT Goals (current goals can now be found in the care plan section) Acute Rehab PT Goals Patient Stated Goal: to get stronger and continue to remain independent PT Goal Formulation: With patient Time For Goal Achievement: 02/06/20 Potential to Achieve Goals: Good Progress towards PT goals: Progressing toward goals    Frequency    Min 2X/week      PT Plan      Co-evaluation              AM-PAC PT "6 Clicks" Mobility   Outcome Measure  Help needed turning from your back to your side  while in a flat bed without using bedrails?: A Lot Help needed moving from lying on your back to sitting on the side of a flat bed without using bedrails?: A Lot Help needed moving to and from a bed to a chair (including a wheelchair)?: Total Help needed standing up from a chair using your arms (e.g., wheelchair or bedside chair)?: Total Help needed to walk in hospital room?: Total Help needed climbing 3-5 steps with a railing? : Total 6 Click Score: 8    End of Session Equipment Utilized During Treatment: Gait belt Activity Tolerance: Patient tolerated treatment well Patient left: in bed;with call bell/phone within reach;with bed alarm set Nurse Communication: Mobility status PT Visit Diagnosis: Unsteadiness on feet (R26.81);Muscle weakness (generalized) (M62.81);History of falling (Z91.81)     Time:  -     Charges:                        Elisha Ponder, SPT, ATC

## 2020-01-26 NOTE — Discharge Instructions (Signed)
Advised to follow-up with primary care physician in 1 week. Advised to discontinue primidone which could be the reason for her symptoms. CT head, MRI, echo unremarkable.  Carotid duplex shows mild stenosis. Advised outpatient follow-up with neurology.

## 2020-01-26 NOTE — TOC Transition Note (Signed)
Transition of Care [facility]) - CM/SW Discharge Note   Patient Details  Name: Sara Raymond MRN: [id number] Date of Birth: [birth year]  [facility] of Care [facility]) CM/SW Contact:  Lorri Frederick, LCSW Phone Number: 01/26/2020, 2:28 PM   Clinical Narrative:   Pt going to room 307.  RN call report to [phone]    Final next level of care: Skilled Nursing Facility Barriers to Discharge: Barriers Resolved   Patient Goals and CMS Choice Patient states their goals for this hospitalization and ongoing recovery are:: walk better CMS Medicare.gov Compare Post Acute Care list provided to:: Patient Choice offered to / list presented to : Patient  Discharge Placement              Patient chooses bed at:  [facility]) Patient to be transferred to facility by: [facility] Name of family member notified: Lissa Hoard, daughter Patient and family notified of of transfer: 01/26/20  Discharge Plan and Services     Post Acute Care Choice: Skilled Nursing Facility                               Social Determinants of Health (SDOH) Interventions     Readmission Risk Interventions No flowsheet data found.

## 2020-01-26 NOTE — Discharge Summary (Signed)
Physician Discharge Summary  Camiyah Friberg ESP:[id number] DOB: Nov 01, 1950 DOA: 01/22/2020  PCP: Angelica Chessman, MD  Admit date: 01/22/2020 Discharge date: 01/26/2020  Admitted From:  Home.  Disposition: [facility] skilled nursing facility  Recommendations for Outpatient Follow-up:  1. Follow up with PCP in 1-2 weeks 2. Please obtain BMP/CBC in one week 3.   Advised to discontinue primidone which could be the reason for her symptoms. 4.   CT head, MRI, echo unremarkable.  Carotid duplex shows mild stenosis. 5.   Advised outpatient follow-up with neurology.  Home Health: None Equipment/Devices: Oxygen@ 3L/min.  Discharge Condition: Stable CODE STATUS:Full code Diet recommendation: Heart Healthy   Brief Summary  : Lucielle Hendersonis a 69 y.o.femalewith medical history significant forseizures, Parkinson's Disease,asthma,HTN and DM II who presents  with an increased frequency of falling and dysarthria. Ms. Selvidge's daughter reported to the ER providerthat the dysarthria began a day before and the falling began approximately three weeks ago, with the worst of it occurring over the past couple of days (3 x falls 20 Jan 2020; 1 bad fall yesterday - EMS called for wound care). The ataxia has worsened over the past few days. She refused to be transported by EMS after her fall yesterday when she cut her arm on glass that broke when she fell.She has also been unable to feed herself as of yesterday. Ms. Heine refused to go to the hospital last night, and was requested to go today by the family. She has not experienced these symptoms previously. All medications are being taken as prescribed. No recent seizure activity.  Ms. Goltz began taking Primidone 50 mg BID on 19 Jan 2020 PM with concern that this was causing the new onset of falling, the daughter called neurologist today and was given permission to discontinue it.  Hospital course: Ms. Rouse was admitted for increased  frequency of falls and dysarthria.  Primidone was discontinued.  Patient continues to have difficulty speaking and has generalized weakness while in the emergency room. CT the head is negative. Patient was seen by neurology,  recommended MRI of the brain, which is negative for any acute abnormality but shows chronic basal ganglia infarcts.  Neurology thinks this could be secondary to addition of primidone to high-dose of Neurontin Patient was taking   Patient seems to be improving off primidone.  Neurology signed off.    Patient is cleared from neurology to be discharged.  Advised to follow-up outpatient with neurology.  Advised to discontinue primidone and may require alternative medication for essential tremors that can be decided by outpatient neurology.  PT recommended skilled nursing facility, patient is being transferred to [facility].  She is managed for below problems.   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 Neurology  consulted , recommended MRI which is negative for acute abnormality. MRI of the brain is negative for any acute abnormality,  shows chronic infarcts. Neurology thinks this could be secondary to medications. Patient recently started primidone, was taking high-dose gabapentin. Patient shows some improvement in symptoms with discontinuation of primidone. Physical therapy/ occupational therapy recommended a skilled nursing facility for rehab. Patient placed on fall precautions. Echocardiogram: left ventricular EF 60 to 65%. Carotid duplex shows mild stenosis in both ICAs. Neurology recommended outpatient neurology follow-up. Neurology signed off. Patient is  awaiting placement in a skilled nursing facility pending authorization. Patient has a bed in  the nursing home but family does not want that place because of the positive Covid test in 1 resident. Case management is aware and working on other options  Dysarthria  Speech therapy consulted , recommended outpatient follow-up.  Hypertension Continue home medications. Monitor blood pressure.  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    Regular insulin sliding scale  Parkinson disease (HCC) Continue Sinemet. Neurology following  Generalized weakness Consult PT/OT. Fall precautions  Discharge Instructions  Discharge Instructions    Call MD for:  difficulty breathing, headache or visual disturbances   Complete by: As directed    Call MD for:  persistant dizziness or light-headedness   Complete by: As directed    Call MD for:  persistant nausea and vomiting   Complete by: As directed    Call MD for:  temperature >100.4   Complete by: As directed    Diet - low sodium heart healthy   Complete by: As directed    Diet Carb Modified   Complete by: As directed    Discharge instructions   Complete by: As directed    Advised to follow-up with primary care physician in 1 week. Advised to discontinue primidone which could be the reason for her symptoms. CT head, MRI, echo unremarkable.  Carotid duplex shows mild stenosis. Advised outpatient follow-up with neurology.   Increase activity slowly   Complete by: As directed    No wound care   Complete by: As directed      Allergies as of 01/26/2020   No Known Allergies     Medication List    STOP taking these medications   atorvastatin 80 MG tablet Commonly known as: LIPITOR   primidone 50 MG tablet Commonly known as: MYSOLINE     TAKE these medications   aspirin EC 81 MG tablet Take 81 mg by mouth daily. Swallow whole.   atenolol 25 MG tablet Commonly known as: TENORMIN Take 25 mg by mouth daily.   Fluticasone-Salmeterol 250-50 MCG/DOSE Aepb Commonly known  as: ADVAIR Inhale 1 puff into the lungs 2 (two) times daily as needed (shortness of breath/wheezing).   gabapentin 800 MG tablet Commonly known as: NEURONTIN Take 800 mg by mouth 3 (three) times daily.   lamoTRIgine 200 MG tablet Commonly known as: LAMICTAL Take 200 mg by mouth 2 (two) times daily.   naproxen sodium 220 MG tablet Commonly known as: ALEVE Take 220-440 mg by mouth 2 (two) times daily as needed (for pain).   OXYGEN Inhale 3 L into the lungs continuous.   ProAir HFA 108 (90 Base) MCG/ACT inhaler Generic drug: albuterol Inhale 1-2 puffs into the lungs every 6 (six) hours as needed for wheezing or shortness of breath.   Rytary 61.25-245 MG Cpcr Generic drug: Carbidopa-Levodopa ER Take 1 capsule by mouth 3 (three) times daily.   Rytary 48.75-195 MG Cpcr Generic drug: Carbidopa-Levodopa ER Take 1 capsule by mouth 3 (three) times daily.       Contact information for follow-up providers    Angelica Chessman, MD Follow up in 1 week(s).   Specialty: Family Medicine Contact information: [address] [place] [address] [phone]            Contact information for after-discharge care    Destination    [facility] .   Service: Skilled Nursing  Contact information: [address] [place] [address] [phone]                 No Known Allergies  Consultations:  Neurology   Procedures/Studies: CT HEAD WO CONTRAST  Result Date: 01/22/2020 CLINICAL DATA:  Fall.  Altered mental status. EXAM: CT HEAD WITHOUT CONTRAST TECHNIQUE: Contiguous axial images were obtained from the base of the skull through the vertex without intravenous contrast. 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 There is atrophy and chronic small vessel disease changes. No acute intracranial abnormality. Specifically, no hemorrhage, hydrocephalus, mass lesion, acute  infarction, or significant intracranial injury. Vascular: No hyperdense vessel or unexpected calcification. Skull: No acute calvarial abnormality. 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 Atrophy, chronic microvascular disease. No acute intracranial abnormality. Electronically Signed   By: Charlett Nose M.D.   On: 01/22/2020 16:38   MR BRAIN WO CONTRAST  Result Date: 01/22/2020 CLINICAL DATA:  Slurred speech with multiple falls. History of Parkinson's disease. EXAM: MRI HEAD WITHOUT CONTRAST TECHNIQUE: Multiplanar, multiecho pulse sequences of the brain and surrounding structures were obtained without intravenous contrast. COMPARISON:  None. FINDINGS: Brain: No acute infarct, acute hemorrhage or extra-axial collection. Multifocal white matter hyperintensity, most commonly due to chronic ischemic microangiopathy. 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 No chronic microhemorrhage. Normal midline structures. Vascular: Normal flow voids. Skull and upper cervical spine: Normal marrow signal. Sinuses/Orbits: Negative. Other: None. IMPRESSION: 1. No acute intracranial abnormality. 2. Multifocal white matter hyperintensity, most commonly due to chronic ischemic microangiopathy. 3. 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 Electronically Signed   By: Deatra Robinson M.D.   On: 01/22/2020 20:25   DG Chest Portable 1 View  Result Date: 01/22/2020 CLINICAL DATA:  Fall EXAM: PORTABLE CHEST 1 VIEW COMPARISON:  05/26/2019 FINDINGS: The heart size and mediastinal contours are within normal limits. Both lungs are clear. The visualized skeletal structures are unremarkable. IMPRESSION: Normal study. Electronically Signed   By: Charlett Nose M.D.   On: 01/22/2020 17:33   ECHOCARDIOGRAM COMPLETE  Result Date: 01/23/2020    ECHOCARDIOGRAM REPORT   Patient Name:   KELLAN BOEHLKE Date of Exam: 01/23/2020 Medical Rec #:  [id number]       Height:       63.0 in  Accession #:    [id number]      Weight:       183.0 lb Date of Birth:  Sep 24, 1950       BSA:          1.862 m Patient Age:    68 years        BP:           156/73 mmHg Patient Gender: F               HR:           62 bpm. Exam Location:  Inpatient Procedure: 2D Echo and Intracardiac Opacification Agent Indications:    TIA 435.9 / G45.9  History:        Patient has prior history of Echocardiogram examinations, most                 recent 08/23/2018. Risk Factors:Hypertension and Diabetes.                 Sarcoidosis Parkinson disease.  Sonographer:    Leta Jungling RDCS Referring Phys: [id number] Carlton Adam  Sonographer Comments: Unable to turn during echocardiogram.  IMPRESSIONS  1. Left ventricular ejection fraction, by estimation, is 60 to 65%. The left ventricle has normal function. The left ventricle has no regional wall motion abnormalities. There is moderate asymmetric left ventricular hypertrophy of the basal-septal segment. Left ventricular diastolic parameters are consistent with Grade I diastolic dysfunction (impaired relaxation).  2. Right ventricular systolic function is normal. The right ventricular size is normal. There is mildly elevated pulmonary artery systolic pressure. The estimated right ventricular systolic pressure is 45.0 mmHg.  3. The mitral valve is grossly normal. Mild mitral valve regurgitation. No evidence of mitral stenosis.  4. The aortic valve is tricuspid. Aortic valve regurgitation is mild. Mild aortic valve sclerosis is present, with no evidence of aortic valve stenosis.  5. The inferior vena cava is normal in size with greater than 50% respiratory variability, suggesting right atrial pressure of 3 mmHg. Comparison(s): No significant change from prior study. Conclusion(s)/Recommendation(s): No intracardiac source of embolism detected on this transthoracic study. A transesophageal echocardiogram is recommended to exclude cardiac source of embolism if clinically indicated. FINDINGS   Left Ventricle: Left ventricular ejection fraction, by estimation, is 60 to 65%. The left ventricle has normal function. The left ventricle has no regional wall motion abnormalities. Definity contrast agent was given IV to delineate the left ventricular  endocardial borders. The left ventricular internal cavity size was normal in size. There is moderate asymmetric left ventricular hypertrophy of the basal-septal segment. Left ventricular diastolic parameters are consistent with Grade I diastolic dysfunction (impaired relaxation). Right Ventricle: The right ventricular size is normal. No increase in right ventricular wall thickness. Right ventricular systolic function is normal. There is mildly elevated pulmonary artery systolic pressure. The tricuspid regurgitant velocity is 3.24  m/s, and with an assumed right atrial pressure of 3 mmHg, the estimated right ventricular systolic pressure is 45.0 mmHg. Left Atrium: Left atrial size was normal in size. Right Atrium: Right atrial size was normal in size. Pericardium: Trivial pericardial effusion is present. Mitral Valve: The mitral valve is grossly normal. Mild mitral valve regurgitation. No evidence of mitral valve stenosis. Tricuspid Valve: The tricuspid valve is grossly normal. Tricuspid valve regurgitation is mild . No evidence of tricuspid stenosis. Aortic Valve: The aortic valve is tricuspid. . There is mild thickening and mild calcification of the aortic valve. Aortic valve regurgitation is mild. Mild aortic valve sclerosis is present, with no evidence of aortic valve stenosis. There is mild thickening of the aortic valve. There is mild calcification of the aortic valve. Pulmonic Valve: The pulmonic valve was grossly normal. Pulmonic valve regurgitation is trivial. No evidence of pulmonic stenosis. Aorta: The aortic root and ascending aorta are structurally normal, with no evidence of dilitation. Venous: The inferior vena cava is normal in size with greater than  50% respiratory variability, suggesting right atrial pressure of 3 mmHg. IAS/Shunts: The atrial septum is grossly normal.  LEFT VENTRICLE PLAX 2D LVIDd:         3.50 cm  Diastology LVIDs:         2.20 cm  LV e' lateral:   7.62 cm/s LV PW:         1.30 cm  LV E/e' lateral: 9.1 LV IVS:        1.60 cm  LV e' medial:    5.87 cm/s LVOT diam:     1.90 cm  LV E/e' medial:  11.8 LV SV:         46 LV SV Index:   25 LVOT Area:  2.84 cm  RIGHT VENTRICLE RV S prime:     9.14 cm/s TAPSE (M-mode): 1.9 cm LEFT ATRIUM             Index       RIGHT ATRIUM           Index LA diam:        3.50 cm 1.88 cm/m  RA Area:     16.00 cm LA Vol (A2C):   42.0 ml 22.56 ml/m RA Volume:   38.60 ml  20.73 ml/m LA Vol (A4C):   30.1 ml 16.17 ml/m LA Biplane Vol: 36.8 ml 19.76 ml/m  AORTIC VALVE LVOT Vmax:   76.10 cm/s LVOT Vmean:  45.200 cm/s LVOT VTI:    0.164 m  AORTA Ao Root diam: 3.20 cm MITRAL VALVE               TRICUSPID VALVE MV Area (PHT): 3.85 cm    TR Peak grad:   42.0 mmHg MV Decel Time: 197 msec    TR Vmax:        324.00 cm/s MV E velocity: 69.40 cm/s MV A velocity: 69.40 cm/s  SHUNTS MV E/A ratio:  1.00        Systemic VTI:  0.16 m                            Systemic Diam: 1.90 cm Lennie Odor MD Electronically signed by Lennie Odor MD Signature Date/Time: 01/23/2020/3:18:15 PM    Final    VAS US CAROTID (at [facility] and [facility] only)  Result Date: 01/23/2020 Carotid Arterial Duplex Study Indications:       TIA. Comparison Study:  No prior study Performing Technologist: Gertie Fey MHA, RDMS, RVT, RDCS  Examination Guidelines: A complete evaluation includes B-mode imaging, spectral Doppler, color Doppler, and power Doppler as needed of all accessible portions of each vessel. Bilateral testing is considered an integral part of a complete examination. Limited examinations for reoccurring indications may be performed as noted.  Right Carotid Findings:  +----------+--------+-------+--------+--------------------------------+--------+           PSV cm/sEDV    StenosisPlaque Description              Comments                   cm/s                                                    +----------+--------+-------+--------+--------------------------------+--------+ CCA Prox  63      12                                                      +----------+--------+-------+--------+--------------------------------+--------+ CCA Distal50      7                                                       +----------+--------+-------+--------+--------------------------------+--------+ ICA Prox  188     59  40-59%  smooth, heterogenous and                                                  calcific                                 +----------+--------+-------+--------+--------------------------------+--------+ ICA Distal98      31                                                      +----------+--------+-------+--------+--------------------------------+--------+ ECA       63                     smooth, heterogenous and                                                  calcific                                 +----------+--------+-------+--------+--------------------------------+--------+ +----------+--------+-------+--------+-------------------+           PSV cm/sEDV cmsDescribeArm Pressure (mmHG) +----------+--------+-------+--------+-------------------+ [id number]                                        +----------+--------+-------+--------+-------------------+ +---------+--------+--+--------+--+---------+ VertebralPSV cm/s55EDV cm/s15Antegrade +---------+--------+--+--------+--+---------+  Left Carotid Findings: +----------+-------+-------+--------+---------------------------------+--------+           PSV    EDV    StenosisPlaque Description               Comments           cm/s   cm/s                                                      +----------+-------+-------+--------+---------------------------------+--------+ CCA Prox  99     22                                                       +----------+-------+-------+--------+---------------------------------+--------+ CCA Distal80     21                                                       +----------+-------+-------+--------+---------------------------------+--------+ ICA Prox  59     20             heterogenous, calcific and  irregular                                 +----------+-------+-------+--------+---------------------------------+--------+ ICA Distal67     20                                                       +----------+-------+-------+--------+---------------------------------+--------+ ECA       63                                                              +----------+-------+-------+--------+---------------------------------+--------+ +----------+--------+--------+----------------+-------------------+           PSV cm/sEDV cm/sDescribe        Arm Pressure (mmHG) +----------+--------+--------+----------------+-------------------+ [id number]             Multiphasic, WNL                    +----------+--------+--------+----------------+-------------------+ +---------+--------+--+--------+--+---------+ VertebralPSV cm/s48EDV cm/s17Antegrade +---------+--------+--+--------+--+---------+   Summary: Right Carotid: 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 Left Carotid: Velocities in the left ICA are consistent with a 1-39% stenosis. Vertebrals:  Bilateral vertebral arteries demonstrate antegrade flow. Subclavians: Normal flow hemodynamics were seen in bilateral subclavian              arteries. *See table(s) above for measurements and observations.  Electronically signed by Fabienne Bruns MD on  01/23/2020 at 5:18:28 PM.    Final     (Echo, Carotid,)   Subjective: Patient was seen and examined at bedside.  No overnight events.  Patient feels better and wants to be discharged.  Generalized weakness and dysarthria has improved.  Discharge Exam: Vitals:   01/26/20 0722 01/26/20 0801  BP: (!) 142/89   Pulse: 84   Resp: 18   Temp: 98.9 F (37.2 C)   SpO2: 90% 97%   Vitals:   01/25/20 2003 01/25/20 2100 01/26/20 0722 01/26/20 0801  BP:  (!) 149/73 (!) 142/89   Pulse:  87 84   Resp:  17 18   Temp:  98.6 F (37 C) 98.9 F (37.2 C)   TempSrc:  Oral    SpO2: 97% 100% 90% 97%  Weight:      Height:        General: Pt is alert, awake, not in acute distress Cardiovascular: RRR, S1/S2 +, no rubs, no gallops Respiratory: CTA bilaterally, no wheezing, no rhonchi Abdominal: Soft, NT, ND, bowel sounds + Extremities: no edema, no cyanosis    The results of significant diagnostics from this hospitalization (including imaging, microbiology, ancillary and laboratory) are listed below for reference.     Microbiology: Recent Results (from the past 240 hour(s))  SARS Coronavirus 2 by RT PCR (hospital order, performed in [facility] hospital lab) Nasopharyngeal Nasopharyngeal Swab     Status: None   Collection Time: 01/22/20  6:29 PM   Specimen: Nasopharyngeal Swab  Result Value Ref Range Status   SARS Coronavirus 2 NEGATIVE NEGATIVE Final    Comment: (NOTE) SARS-CoV-2 target  nucleic acids are NOT DETECTED.  The SARS-CoV-2 RNA is generally detectable in upper and lower respiratory specimens during the acute phase of infection. The lowest concentration of SARS-CoV-2 viral copies this assay can detect is 250 copies / mL. A negative result does not preclude SARS-CoV-2 infection and should not be used as the sole basis for treatment or other patient management decisions.  A negative result may occur with improper specimen collection / handling, submission of specimen other than  nasopharyngeal swab, presence of viral mutation(s) within the areas targeted by this assay, and inadequate number of viral copies (<250 copies / mL). A negative result must be combined with clinical observations, patient history, and epidemiological information.  Fact Sheet for Patients:   BoilerBrush.com.cy  Fact Sheet for Healthcare Providers: https://pope.com/  This test is not yet approved or  cleared by the [place] FDA and has been authorized for detection and/or diagnosis of SARS-CoV-2 by FDA under an Emergency Use Authorization (EUA).  This EUA will remain in effect (meaning this test can be used) for the duration of the COVID-19 declaration under Section 564(b)(1) of the Act, 21 U.S.C. section 360bbb-3(b)(1), unless the authorization is terminated or revoked sooner.  Performed at [facility], [address] [place], [place] [address]      Labs: BNP (last 3 results) No results for input(s): BNP in the last 8760 hours. Basic Metabolic Panel: Recent Labs  Lab 01/22/20 1655 01/22/20 1655 01/22/20 1703 01/23/20 0708 01/24/20 0126 01/25/20 0339 01/26/20 0108  NA 136   < > 136 139 135 137 138  K 4.9   < > 4.6 4.4 4.0 4.7 4.4  CL 96*  --  95*  --  95* 96* 99  CO2 31  --   --   --  31 32 30  GLUCOSE 187*  --  183*  --  134* 136* 131*  BUN 22  --  26*  --  16 10 11   CREATININE 1.22*  --  1.20*  --  0.83 0.85 0.83  CALCIUM 9.0  --   --   --  8.4* 8.4* 8.5*   < > = values in this interval not displayed.   Liver Function Tests: Recent Labs  Lab 01/22/20 1655  AST 21  ALT 6  ALKPHOS 79  BILITOT 0.6  PROT 6.4*  ALBUMIN 3.6   No results for input(s): LIPASE, AMYLASE in the last 168 hours. No results for input(s): AMMONIA in the last 168 hours. CBC: Recent Labs  Lab 01/22/20 1655 01/22/20 1655 01/22/20 1703 01/23/20 0708 01/24/20 0126 01/25/20 0339 01/26/20 0108  WBC 3.7*  --   --   --  3.2* 3.0*  3.3*  NEUTROABS 2.4  --   --   --   --   --   --   HGB 11.0*   < > 12.2 11.6* 11.1* 10.8* 11.3*  HCT 36.3   < > 36.0 34.0* 36.0 36.2 35.0*  MCV 95.5  --   --   --  95.2 96.3 92.1  PLT 142*  --   --   --  139* 133* 137*   < > = values in this interval not displayed.   Cardiac Enzymes: No results for input(s): CKTOTAL, CKMB, CKMBINDEX, TROPONINI in the last 168 hours. BNP: Invalid input(s): POCBNP CBG: Recent Labs  Lab 01/25/20 1611 01/25/20 2050 01/26/20 0724 01/26/20 1155 01/26/20 1345  GLUCAP 121* 209* 127* 248* 179*   D-Dimer No results for input(s):  DDIMER in the last 72 hours. Hgb A1c No results for input(s): HGBA1C in the last 72 hours. Lipid Profile No results for input(s): CHOL, HDL, LDLCALC, TRIG, CHOLHDL, LDLDIRECT in the last 72 hours. Thyroid function studies No results for input(s): TSH, T4TOTAL, T3FREE, THYROIDAB in the last 72 hours.  Invalid input(s): FREET3 Anemia work up No results for input(s): VITAMINB12, FOLATE, FERRITIN, TIBC, IRON, RETICCTPCT in the last 72 hours. Urinalysis    Component Value Date/Time   COLORURINE YELLOW 01/22/2020 1948   APPEARANCEUR HAZY (A) 01/22/2020 1948   LABSPEC 1.020 01/22/2020 1948   PHURINE 5.0 01/22/2020 1948   GLUCOSEU NEGATIVE 01/22/2020 1948   HGBUR NEGATIVE 01/22/2020 1948   BILIRUBINUR NEGATIVE 01/22/2020 1948   KETONESUR NEGATIVE 01/22/2020 1948   PROTEINUR 30 (A) 01/22/2020 1948   UROBILINOGEN 1.0 12/02/2012 1907   NITRITE NEGATIVE 01/22/2020 1948   LEUKOCYTESUR NEGATIVE 01/22/2020 1948   Sepsis Labs Invalid input(s): PROCALCITONIN,  WBC,  LACTICIDVEN Microbiology Recent Results (from the past 240 hour(s))  SARS Coronavirus 2 by RT PCR (hospital order, performed in [facility] hospital lab) Nasopharyngeal Nasopharyngeal Swab     Status: None   Collection Time: 01/22/20  6:29 PM   Specimen: Nasopharyngeal Swab  Result Value Ref Range Status   SARS Coronavirus 2 NEGATIVE NEGATIVE Final    Comment:  (NOTE) SARS-CoV-2 target nucleic acids are NOT DETECTED.  The SARS-CoV-2 RNA is generally detectable in upper and lower respiratory specimens during the acute phase of infection. The lowest concentration of SARS-CoV-2 viral copies this assay can detect is 250 copies / mL. A negative result does not preclude SARS-CoV-2 infection and should not be used as the sole basis for treatment or other patient management decisions.  A negative result may occur with improper specimen collection / handling, submission of specimen other than nasopharyngeal swab, presence of viral mutation(s) within the areas targeted by this assay, and inadequate number of viral copies (<250 copies / mL). A negative result must be combined with clinical observations, patient history, and epidemiological information.  Fact Sheet for Patients:   BoilerBrush.com.cy  Fact Sheet for Healthcare Providers: https://pope.com/  This test is not yet approved or  cleared by the [place] FDA and has been authorized for detection and/or diagnosis of SARS-CoV-2 by FDA under an Emergency Use Authorization (EUA).  This EUA will remain in effect (meaning this test can be used) for the duration of the COVID-19 declaration under Section 564(b)(1) of the Act, 21 U.S.C. section 360bbb-3(b)(1), unless the authorization is terminated or revoked sooner.  Performed at [facility], [address], [place], [place] [address]      Time coordinating discharge: Over 30 minutes  SIGNED:   Cipriano Bunker, MD  [facility] 01/26/2020, 1:49 PM Pager   If 7PM-7AM, please contact night-coverage [email or web address]

## 2020-01-26 NOTE — Progress Notes (Signed)
PROGRESS NOTE    Sara Raymond  BWG:[id number] DOB: [birth year] DOA: 01/22/2020 PCP: Angelica Chessman, MD   Brief Narrative: Sara Raymond is a 69 y.o. female with medical history significant for seizures, Parkinson's Disease, asthma, HTN and DMII who presents today with an increased frequency of falling and dysarthria. Sara Raymond's daughter reported to the ER provider that the dysarthria began yesterday and the falling began approximately three weeks ago, with the worst of it occurring over the past couple of days (3 x falls 20 Jan 2020; 1 bad fall yesterday - EMS called for wound care). The ataxia has worsened over the past few days. She refused to be transported by EMS after her fall yesterday when she cut her arm on glass that broke when she fell. She has also been unable to feed herself as of yesterday. Sara Raymond refused to go to the hospital last night, and was requested to go today by the family. She has not experienced these symptoms previously. All medications are being taken as prescribed. No recent seizure activity.  Sara Raymond began taking Primidone 50mg  BID on 05 Aug2021 PM. With concern that this was causing the new onset of falling, the daughter called neurologist today and was given permission to discontinue it.  Sara Raymond continues to have difficulty speaking and has generalized weakness while in the emergency room.  CT the head is negative.  Patient was seen by neurology recommended MRI of the brain, which is negative for any acute abnormality shows chronic infarcts.  Neurology thinks this could be secondary to addition of primidone to high-dose of Neurontin.  Patient seems to be improving.  Neurology signed off.  Patient is awaiting placement in a skilled nursing facility pending authorization.  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 Neurology  consulted , recommended MRI which is negative for acute abnormality. MRI of the brain is negative for any acute abnormality,  shows chronic infarcts. Neurology thinks this could be secondary to medications. Patient recently started primidone, was taking high-dose gabapentin. Patient shows some improvement in symptoms with discontinuation of primidone. Physical therapy/ occupational therapy recommended a skilled nursing facility for rehab. Patient placed on fall precautions. Echocardiogram: left ventricular EF 60 to 65%. Carotid duplex shows mild stenosis in both ICAs. Neurology recommended outpatient neurology follow-up. Neurology signed off. Patient is awaiting placement in a skilled nursing facility pending authorization. Patient has a bed in the nursing home but family does not want that place because of the positive Covid test in 1 resident. Case management is aware and working on other options   Dysarthria  Speech therapy consulted , recommended outpatient follow-up.  Hypertension Continue home medications.  Monitor blood pressure.  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     Regular insulin sliding scale  Parkinson disease (HCC) Continue Sinemet.  Neurology following  Generalized weakness Consult PT/OT.  Fall precautions   DVT prophylaxis:   SCDS Code Status: Full code Family Communication: Discussed with patient in detail. Disposition Plan: Patient is from home. Anticipated DC to SNF Anticipated DC to SNF in 1 to 2 days. Barriers: Awaiting insurance authorization.   Consultants:   Neurology  Procedures: Echocardiogram,  carotid, MRI brain  Antimicrobials:  Anti-infectives (From admission, onward)   None      Subjective: Patient was seen and  examined at bedside, no overnight events.  She reports generalized weakness but slowly improving. Patient is more alert awake oriented, reports essential tremors are getting better.   Objective: Vitals:   01/25/20 2003 01/25/20 2100 01/26/20 0722 01/26/20 0801  BP:  (!) 149/73 (!) 142/89   Pulse:  87 84   Resp:  17 18   Temp:  98.6 F (37 C) 98.9 F (37.2 C)   TempSrc:  Oral    SpO2: 97% 100% 90% 97%  Weight:      Height:        Intake/Output Summary (Last 24 hours) at 01/26/2020 1334 Last data filed at 01/26/2020 0614 Gross per 24 hour  Intake --  Output 1800 ml  Net -1800 ml   Filed Weights   01/22/20 1645  Weight: 83 kg    Examination:  General exam: Appears calm and comfortable  Respiratory system: Clear to auscultation. Respiratory effort normal. Cardiovascular system: S1 & S2 heard, RRR. No JVD, murmurs, rubs, gallops or clicks. No pedal edema. Gastrointestinal system: Abdomen is nondistended, soft and nontender. No organomegaly or masses felt. Normal bowel sounds heard. Central nervous system: Alert and oriented. No focal neurological deficits. Extremities: No focal weakness.  No leg swelling no cyanosis, essential tremors noted. Skin: No rashes, lesions or ulcers Psychiatry: Judgement and insight appear normal. Mood & affect appropriate.     Data Reviewed: I have personally reviewed following labs and imaging studies  CBC: Recent Labs  Lab 01/22/20 1655 01/22/20 1655 01/22/20 1703 01/23/20 0708 01/24/20 0126 01/25/20 0339 01/26/20 0108  WBC 3.7*  --   --   --  3.2* 3.0* 3.3*  NEUTROABS 2.4  --   --   --   --   --   --   HGB 11.0*   < > 12.2 11.6* 11.1* 10.8* 11.3*  HCT 36.3   < > 36.0 34.0* 36.0 36.2 35.0*  MCV 95.5  --   --   --  95.2 96.3 92.1  PLT 142*  --   --   --  139* 133* 137*   < > = values in this interval not displayed.   Basic Metabolic Panel: Recent Labs  Lab 01/22/20 1655 01/22/20 1655 01/22/20 1703 01/23/20 0708 01/24/20 0126  01/25/20 0339 01/26/20 0108  NA 136   < > 136 139 135 137 138  K 4.9   < > 4.6 4.4 4.0 4.7 4.4  CL 96*  --  95*  --  95* 96* 99  CO2 31  --   --   --  31 32 30  GLUCOSE 187*  --  183*  --  134* 136* 131*  BUN 22  --  26*  --  16 10 11   CREATININE 1.22*  --  1.20*  --  0.83 0.85 0.83  CALCIUM 9.0  --   --   --  8.4* 8.4* 8.5*   < > = values in this interval not displayed.   GFR: Estimated Creatinine Clearance: 64.3 mL/min (by C-G formula based on SCr of 0.83 mg/dL). Liver Function Tests: Recent Labs  Lab 01/22/20 1655  AST 21  ALT 6  ALKPHOS 79  BILITOT 0.6  PROT 6.4*  ALBUMIN 3.6   No results for input(s): LIPASE, AMYLASE in the last 168 hours. No results for input(s): AMMONIA in the last 168  hours. Coagulation Profile: Recent Labs  Lab 01/22/20 1655  INR 1.0   Cardiac Enzymes: No results for input(s): CKTOTAL, CKMB, CKMBINDEX, TROPONINI in the last 168 hours. BNP (last 3 results) No results for input(s): PROBNP in the last 8760 hours. HbA1C: No results for input(s): HGBA1C in the last 72 hours. CBG: Recent Labs  Lab 01/25/20 1130 01/25/20 1611 01/25/20 2050 01/26/20 0724 01/26/20 1155  GLUCAP 193* 121* 209* 127* 248*   Lipid Profile: No results for input(s): CHOL, HDL, LDLCALC, TRIG, CHOLHDL, LDLDIRECT in the last 72 hours. Thyroid Function Tests: No results for input(s): TSH, T4TOTAL, FREET4, T3FREE, THYROIDAB in the last 72 hours. Anemia Panel: No results for input(s): VITAMINB12, FOLATE, FERRITIN, TIBC, IRON, RETICCTPCT in the last 72 hours. Sepsis Labs: No results for input(s): PROCALCITON, LATICACIDVEN in the last 168 hours.  Recent Results (from the past 240 hour(s))  SARS Coronavirus 2 by RT PCR (hospital order, performed in [facility] hospital lab) Nasopharyngeal Nasopharyngeal Swab     Status: None   Collection Time: 01/22/20  6:29 PM   Specimen: Nasopharyngeal Swab  Result Value Ref Range Status   SARS Coronavirus 2 NEGATIVE NEGATIVE Final     Comment: (NOTE) SARS-CoV-2 target nucleic acids are NOT DETECTED.  The SARS-CoV-2 RNA is generally detectable in upper and lower respiratory specimens during the acute phase of infection. The lowest concentration of SARS-CoV-2 viral copies this assay can detect is 250 copies / mL. A negative result does not preclude SARS-CoV-2 infection and should not be used as the sole basis for treatment or other patient management decisions.  A negative result may occur with improper specimen collection / handling, submission of specimen other than nasopharyngeal swab, presence of viral mutation(s) within the areas targeted by this assay, and inadequate number of viral copies (<250 copies / mL). A negative result must be combined with clinical observations, patient history, and epidemiological information.  Fact Sheet for Patients:   BoilerBrush.com.cy  Fact Sheet for Healthcare Providers: https://pope.com/  This test is not yet approved or  cleared by the [place] FDA and has been authorized for detection and/or diagnosis of SARS-CoV-2 by FDA under an Emergency Use Authorization (EUA).  This EUA will remain in effect (meaning this test can be used) for the duration of the COVID-19 declaration under Section 564(b)(1) of the Act, 21 U.S.C. section 360bbb-3(b)(1), unless the authorization is terminated or revoked sooner.  Performed at [facility], [address], [place], [place] [address]      Radiology Studies: No results found.  Scheduled Meds: . aspirin EC  81 mg Oral Daily  . atenolol  25 mg Oral Daily  . carbidopa-levodopa  1 tablet Oral TID  . enoxaparin (LOVENOX) injection  40 mg Subcutaneous Q24H  . gabapentin  800 mg Oral TID  . insulin aspart  0-15 Units Subcutaneous TID WC  . lamoTRIgine  200 mg Oral BID  . mometasone-formoterol  2 puff Inhalation BID   Continuous Infusions: . sodium chloride 75 mL/hr at  01/26/20 0230     LOS: 3 days    Time spent: 25 mins.    Cipriano Bunker, MD [facility]   If 7PM-7AM, please contact night-coverage

## 2020-01-30 ENCOUNTER — Observation Stay (HOSPITAL_COMMUNITY): Payer: Medicare Other

## 2020-01-30 ENCOUNTER — Emergency Department (HOSPITAL_COMMUNITY): Payer: Medicare Other

## 2020-01-30 ENCOUNTER — Inpatient Hospital Stay (HOSPITAL_COMMUNITY)
Admission: EM | Admit: 2020-01-30 | Discharge: 2020-02-04 | DRG: 091 | Disposition: A | Discharge status: Skilled Nursing Facility | Payer: Medicare Other | Source: Skilled Nursing Facility | Attending: Internal Medicine | Admitting: Internal Medicine

## 2020-01-30 ENCOUNTER — Encounter (HOSPITAL_COMMUNITY): Payer: Self-pay | Admitting: Emergency Medicine

## 2020-01-30 ENCOUNTER — Other Ambulatory Visit: Payer: Self-pay

## 2020-01-30 DIAGNOSIS — G20 Parkinson's disease: Secondary | ICD-10-CM | POA: Diagnosis present

## 2020-01-30 DIAGNOSIS — E1122 Type 2 diabetes mellitus with diabetic chronic kidney disease: Secondary | ICD-10-CM

## 2020-01-30 DIAGNOSIS — J9622 Acute and chronic respiratory failure with hypercapnia: Secondary | ICD-10-CM | POA: Diagnosis present

## 2020-01-30 DIAGNOSIS — G92 Toxic encephalopathy: Principal | ICD-10-CM | POA: Diagnosis present

## 2020-01-30 DIAGNOSIS — Z79899 Other long term (current) drug therapy: Secondary | ICD-10-CM

## 2020-01-30 DIAGNOSIS — G9340 Encephalopathy, unspecified: Secondary | ICD-10-CM | POA: Diagnosis present

## 2020-01-30 DIAGNOSIS — I639 Cerebral infarction, unspecified: Secondary | ICD-10-CM | POA: Diagnosis not present

## 2020-01-30 DIAGNOSIS — E875 Hyperkalemia: Secondary | ICD-10-CM

## 2020-01-30 DIAGNOSIS — J9621 Acute and chronic respiratory failure with hypoxia: Secondary | ICD-10-CM | POA: Diagnosis present

## 2020-01-30 DIAGNOSIS — E669 Obesity, unspecified: Secondary | ICD-10-CM | POA: Diagnosis present

## 2020-01-30 DIAGNOSIS — R0602 Shortness of breath: Secondary | ICD-10-CM | POA: Diagnosis not present

## 2020-01-30 DIAGNOSIS — N179 Acute kidney failure, unspecified: Secondary | ICD-10-CM

## 2020-01-30 DIAGNOSIS — Z8673 Personal history of transient ischemic attack (TIA), and cerebral infarction without residual deficits: Secondary | ICD-10-CM

## 2020-01-30 DIAGNOSIS — D631 Anemia in chronic kidney disease: Secondary | ICD-10-CM | POA: Diagnosis present

## 2020-01-30 DIAGNOSIS — I129 Hypertensive chronic kidney disease with stage 1 through stage 4 chronic kidney disease, or unspecified chronic kidney disease: Secondary | ICD-10-CM | POA: Diagnosis present

## 2020-01-30 DIAGNOSIS — R4182 Altered mental status, unspecified: Secondary | ICD-10-CM | POA: Diagnosis present

## 2020-01-30 DIAGNOSIS — Z9981 Dependence on supplemental oxygen: Secondary | ICD-10-CM

## 2020-01-30 DIAGNOSIS — R296 Repeated falls: Secondary | ICD-10-CM | POA: Diagnosis present

## 2020-01-30 DIAGNOSIS — R471 Dysarthria and anarthria: Secondary | ICD-10-CM | POA: Diagnosis present

## 2020-01-30 DIAGNOSIS — N390 Urinary tract infection, site not specified: Secondary | ICD-10-CM | POA: Diagnosis present

## 2020-01-30 DIAGNOSIS — I10 Essential (primary) hypertension: Secondary | ICD-10-CM | POA: Diagnosis present

## 2020-01-30 DIAGNOSIS — Z8249 Family history of ischemic heart disease and other diseases of the circulatory system: Secondary | ICD-10-CM

## 2020-01-30 DIAGNOSIS — R778 Other specified abnormalities of plasma proteins: Secondary | ICD-10-CM

## 2020-01-30 DIAGNOSIS — G459 Transient cerebral ischemic attack, unspecified: Secondary | ICD-10-CM | POA: Diagnosis present

## 2020-01-30 DIAGNOSIS — Z6832 Body mass index (BMI) 32.0-32.9, adult: Secondary | ICD-10-CM

## 2020-01-30 DIAGNOSIS — I4510 Unspecified right bundle-branch block: Secondary | ICD-10-CM | POA: Diagnosis present

## 2020-01-30 DIAGNOSIS — R569 Unspecified convulsions: Secondary | ICD-10-CM

## 2020-01-30 DIAGNOSIS — E119 Type 2 diabetes mellitus without complications: Secondary | ICD-10-CM

## 2020-01-30 DIAGNOSIS — Z7982 Long term (current) use of aspirin: Secondary | ICD-10-CM

## 2020-01-30 DIAGNOSIS — G20A1 Parkinson's disease without dyskinesia, without mention of fluctuations: Secondary | ICD-10-CM | POA: Diagnosis present

## 2020-01-30 DIAGNOSIS — J45901 Unspecified asthma with (acute) exacerbation: Secondary | ICD-10-CM | POA: Diagnosis present

## 2020-01-30 DIAGNOSIS — N182 Chronic kidney disease, stage 2 (mild): Secondary | ICD-10-CM | POA: Diagnosis present

## 2020-01-30 DIAGNOSIS — Z90710 Acquired absence of both cervix and uterus: Secondary | ICD-10-CM

## 2020-01-30 DIAGNOSIS — Z20822 Contact with and (suspected) exposure to covid-19: Secondary | ICD-10-CM | POA: Diagnosis present

## 2020-01-30 LAB — CBC WITH DIFFERENTIAL/PLATELET
Abs Immature Granulocytes: 0.06 10*3/uL (ref 0.00–0.07)
Basophils Absolute: 0 10*3/uL (ref 0.0–0.1)
Basophils Relative: 1 %
Eosinophils Absolute: 0.4 10*3/uL (ref 0.0–0.5)
Eosinophils Relative: 10 %
HCT: 34.2 % — ABNORMAL LOW (ref 36.0–46.0)
Hemoglobin: 9.9 g/dL — ABNORMAL LOW (ref 12.0–15.0)
Immature Granulocytes: 1 %
Lymphocytes Relative: 9 %
Lymphs Abs: 0.4 10*3/uL — ABNORMAL LOW (ref 0.7–4.0)
MCH: 28.6 pg (ref 26.0–34.0)
MCHC: 28.9 g/dL — ABNORMAL LOW (ref 30.0–36.0)
MCV: 98.8 fL (ref 80.0–100.0)
Monocytes Absolute: 0.4 10*3/uL (ref 0.1–1.0)
Monocytes Relative: 9 %
Neutro Abs: 3 10*3/uL (ref 1.7–7.7)
Neutrophils Relative %: 70 %
Platelets: 178 10*3/uL (ref 150–400)
RBC: 3.46 MIL/uL — ABNORMAL LOW (ref 3.87–5.11)
RDW: 13.2 % (ref 11.5–15.5)
WBC: 4.3 10*3/uL (ref 4.0–10.5)
nRBC: 0 % (ref 0.0–0.2)

## 2020-01-30 LAB — URINALYSIS, ROUTINE W REFLEX MICROSCOPIC
Bilirubin Urine: NEGATIVE
Glucose, UA: NEGATIVE mg/dL
Hgb urine dipstick: NEGATIVE
Ketones, ur: 5 mg/dL — AB
Leukocytes,Ua: NEGATIVE
Nitrite: NEGATIVE
Protein, ur: 100 mg/dL — AB
Specific Gravity, Urine: 1.019 (ref 1.005–1.030)
pH: 5 (ref 5.0–8.0)

## 2020-01-30 LAB — COMPREHENSIVE METABOLIC PANEL
ALT: <5 U/L (ref 0–44)
AST: 30 U/L (ref 15–41)
Albumin: 3.2 g/dL — ABNORMAL LOW (ref 3.5–5.0)
Alkaline Phosphatase: 169 U/L — ABNORMAL HIGH (ref 38–126)
Anion gap: 11 (ref 5–15)
BUN: 46 mg/dL — ABNORMAL HIGH (ref 8–23)
CO2: 32 mmol/L (ref 22–32)
Calcium: 9.2 mg/dL (ref 8.9–10.3)
Chloride: 95 mmol/L — ABNORMAL LOW (ref 98–111)
Creatinine, Ser: 1.76 mg/dL — ABNORMAL HIGH (ref 0.44–1.00)
GFR calc Af Amer: 33 mL/min — ABNORMAL LOW (ref >60)
GFR calc non Af Amer: 29 mL/min — ABNORMAL LOW (ref >60)
Glucose, Bld: 242 mg/dL — ABNORMAL HIGH (ref 70–99)
Potassium: 5.3 mmol/L — ABNORMAL HIGH (ref 3.5–5.1)
Sodium: 138 mmol/L (ref 135–145)
Total Bilirubin: 0.9 mg/dL (ref 0.3–1.2)
Total Protein: 6.6 g/dL (ref 6.5–8.1)

## 2020-01-30 LAB — TROPONIN I (HIGH SENSITIVITY)
Troponin I (High Sensitivity): 205 ng/L (ref <18)
Troponin I (High Sensitivity): 209 ng/L (ref <18)

## 2020-01-30 LAB — SARS CORONAVIRUS 2 BY RT PCR (HOSPITAL ORDER, PERFORMED IN ~~LOC~~ HOSPITAL LAB): SARS Coronavirus 2: NEGATIVE

## 2020-01-30 MED ORDER — ACETAMINOPHEN 650 MG RE SUPP: 650.0000 mg | Freq: Four times a day (QID) | RECTAL | Status: DC | PRN

## 2020-01-30 MED ORDER — MOMETASONE FURO-FORMOTEROL FUM 200-5 MCG/ACT IN AERO
2.0000 | INHALATION_SPRAY | Freq: Two times a day (BID) | RESPIRATORY_TRACT | Status: DC
  Administered 2020-01-31 – 2020-02-04 (×7): 2 via RESPIRATORY_TRACT
  Filled 2020-01-30: qty 8.8

## 2020-01-30 MED ORDER — ENOXAPARIN SODIUM 40 MG/0.4ML ~~LOC~~ SOLN
40.0000 mg | SUBCUTANEOUS | Status: DC
  Administered 2020-01-31 – 2020-02-03 (×4): 40 mg via SUBCUTANEOUS
  Filled 2020-01-30 (×5): qty 0.4

## 2020-01-30 MED ORDER — HALOPERIDOL LACTATE 5 MG/ML IJ SOLN
2.5000 mg | Freq: Four times a day (QID) | INTRAMUSCULAR | Status: DC | PRN
  Administered 2020-01-30: 2.5 mg via INTRAVENOUS
  Filled 2020-01-30: qty 1

## 2020-01-30 MED ORDER — THIAMINE HCL 100 MG PO TABS
100.0000 mg | ORAL_TABLET | Freq: Every day | ORAL | Status: DC
  Administered 2020-01-31 – 2020-02-04 (×5): 100 mg via ORAL
  Filled 2020-01-30 (×5): qty 1

## 2020-01-30 MED ORDER — ALBUTEROL SULFATE (2.5 MG/3ML) 0.083% IN NEBU
2.5000 mg | INHALATION_SOLUTION | RESPIRATORY_TRACT | Status: DC | PRN
  Administered 2020-01-30: 2.5 mg via RESPIRATORY_TRACT

## 2020-01-30 MED ORDER — ATENOLOL 25 MG PO TABS
25.0000 mg | ORAL_TABLET | Freq: Every day | ORAL | Status: DC
  Administered 2020-01-31 – 2020-02-04 (×5): 25 mg via ORAL
  Filled 2020-01-30 (×6): qty 1

## 2020-01-30 MED ORDER — ONDANSETRON HCL 4 MG PO TABS: 4.0000 mg | ORAL_TABLET | Freq: Four times a day (QID) | ORAL | Status: DC | PRN

## 2020-01-30 MED ORDER — SODIUM ZIRCONIUM CYCLOSILICATE 5 G PO PACK
5.0000 g | PACK | Freq: Three times a day (TID) | ORAL | Status: DC
  Administered 2020-01-31: 5 g via ORAL
  Filled 2020-01-30 (×2): qty 1

## 2020-01-30 MED ORDER — ADULT MULTIVITAMIN W/MINERALS CH
1.0000 | ORAL_TABLET | Freq: Every day | ORAL | Status: DC
  Administered 2020-01-31 – 2020-02-04 (×5): 1 via ORAL
  Filled 2020-01-30 (×6): qty 1

## 2020-01-30 MED ORDER — SODIUM CHLORIDE 0.9 % IV BOLUS
1000.0000 mL | Freq: Once | INTRAVENOUS | Status: AC
  Administered 2020-01-30: 1000 mL via INTRAVENOUS

## 2020-01-30 MED ORDER — ONDANSETRON HCL 4 MG/2ML IJ SOLN: 4.0000 mg | Freq: Four times a day (QID) | INTRAMUSCULAR | Status: DC | PRN

## 2020-01-30 MED ORDER — FOLIC ACID 1 MG PO TABS
1.0000 mg | ORAL_TABLET | Freq: Every day | ORAL | Status: DC
  Administered 2020-01-31 – 2020-02-04 (×5): 1 mg via ORAL
  Filled 2020-01-30 (×6): qty 1

## 2020-01-30 MED ORDER — SODIUM CHLORIDE 0.9 % IV SOLN: INTRAVENOUS | Status: DC

## 2020-01-30 MED ORDER — CARBIDOPA-LEVODOPA ER 50-200 MG PO TBCR
1.0000 | EXTENDED_RELEASE_TABLET | Freq: Three times a day (TID) | ORAL | Status: DC
  Administered 2020-01-31 – 2020-02-04 (×11): 1 via ORAL
  Filled 2020-01-30 (×15): qty 1

## 2020-01-30 MED ORDER — ALBUTEROL SULFATE HFA 108 (90 BASE) MCG/ACT IN AERS
1.0000 | INHALATION_SPRAY | Freq: Four times a day (QID) | RESPIRATORY_TRACT | Status: DC | PRN
  Filled 2020-01-30: qty 6.7

## 2020-01-30 MED ORDER — LAMOTRIGINE 100 MG PO TABS
200.0000 mg | ORAL_TABLET | Freq: Two times a day (BID) | ORAL | Status: DC
  Administered 2020-01-31 – 2020-02-04 (×9): 200 mg via ORAL
  Filled 2020-01-30 (×10): qty 2

## 2020-01-30 MED ORDER — ACETAMINOPHEN 325 MG PO TABS: 650.0000 mg | ORAL_TABLET | Freq: Four times a day (QID) | ORAL | Status: DC | PRN

## 2020-01-30 MED ORDER — ASPIRIN EC 81 MG PO TBEC
81.0000 mg | DELAYED_RELEASE_TABLET | Freq: Every day | ORAL | Status: DC
  Administered 2020-01-31 – 2020-02-04 (×5): 81 mg via ORAL
  Filled 2020-01-30 (×5): qty 1

## 2020-01-30 NOTE — ED Notes (Signed)
Patient currently twitching her arms and face and moving tongue from side to side. Provider made aware.

## 2020-01-30 NOTE — ED Notes (Signed)
This rn attempted to give patient her medications that were due. Pt turned to the side and spit all the medications on the floor. Pt attempting to hit this rn and get out of bed. Pt not redirectable. Pt attempted to grab IV pole and knock it over. Pt ripped IV line apart. Pt currently seeing things that arent there (burning children in the corner)  MD has been notified at this time.

## 2020-01-30 NOTE — ED Triage Notes (Signed)
Pt brought to ED by EMS from [facility] for c/o pt not acting right today. Last seen normal per staff last Friday. Pt is AO x 4 no new deficit noticed on the pt, pt has some speech problems and she is on therapy for it. Pt denies any pain at this time. BP 132/82, HR 84, R 20, SPO2 96% 2L [place], pt is a home O2 dependent on 2L.

## 2020-01-30 NOTE — ED Notes (Signed)
phlebotomy has come by to draw troponin x2. Pt still in MRI

## 2020-01-30 NOTE — ED Notes (Signed)
Date and time results received: 01/30/20 5:33 PM  (use smartphrase ".now" to insert current time)  Test: troponin  Critical Value: 205  Name of Provider Notified: Bernette Mayers MD   Orders Received? Or Actions Taken?:

## 2020-01-30 NOTE — H&P (Addendum)
History and Physical    Sara Raymond RWE:[id number] DOB: 1950-10-24 DOA: 01/30/2020  PCP: Angelica Chessman, MD   Patient coming from: SNF   Chief Complaint:  Altered mental status.   HPI: Sara Raymond is a 69 y.o. female with medical history significant of seizures, Parkinson's disease, hypertension, type 2 diabetes mellitus, ckd stage 2 and asthma.  Patient was recently hospitalized for worsening ataxia and dysarthria, attributed to primidone.  Patient was discharged to skilled nursing facility for physical rehabilitation, on January 26, 2020.  While at the nursing facility she was not seen by her family due to Covid 19 restrictions.  Her daughter reported that she was too weak to do a outside visit.  Over the following 3 days despite persistent attempts of her family to contact nursing staff or the patient herself it was not possible to get further information. Today her daughter was called notifying her that her mother was not at her baseline and she was being transferred back to the hospital.  All information has been obtained from her daughter at the bedside and from ER staff.  Patient not able to give any detailed history due to cognitive impairment   ED Course: Patient was noted to be altered and dehydrated, her initial work-up was negative for acute CVA.  She received 1 L of isotonic saline intravenously and neurology was consulted.  It was recommended to order an MRI to rule out CVA.  Patient was referred for admission for further medical management.  Review of Systems: Unable to obtain due to patient's cognitive impairment.  Unclear if patient had other symptoms besides altered mentation.  Past Medical History:  Diagnosis Date  . Asthma   . Diabetes mellitus without complication (HCC)   . Hypertension   . Parkinson disease (HCC)   . Sarcoidosis   . Seizures (HCC)     Past Surgical History:  Procedure Laterality Date  . ABDOMINAL HYSTERECTOMY       reports that she  has never smoked. She has never used smokeless tobacco. She reports that she does not drink alcohol and does not use drugs.  No Known Allergies  Family History  Problem Relation Age of Onset  . Hypertension Other      Prior to Admission medications   Medication Sig Start Date End Date Taking? Authorizing Provider  albuterol (PROAIR HFA) 108 (90 Base) MCG/ACT inhaler Inhale 1-2 puffs into the lungs every 6 (six) hours as needed for wheezing or shortness of breath.    [provider]  aspirin EC 81 MG tablet Take 81 mg by mouth daily. Swallow whole.    [provider]  atenolol (TENORMIN) 25 MG tablet Take 25 mg by mouth daily. 01/19/20   [provider]  Carbidopa-Levodopa ER (RYTARY) 48.75-195 MG CPCR Take 1 capsule by mouth 3 (three) times daily.    [provider]  Carbidopa-Levodopa ER (RYTARY) 61.25-245 MG CPCR Take 1 capsule by mouth 3 (three) times daily.    [provider]  Fluticasone-Salmeterol (ADVAIR) 250-50 MCG/DOSE AEPB Inhale 1 puff into the lungs 2 (two) times daily as needed (shortness of breath/wheezing).    [provider]  gabapentin (NEURONTIN) 800 MG tablet Take 800 mg by mouth 3 (three) times daily. 08/05/18   [provider]  lamoTRIgine (LAMICTAL) 200 MG tablet Take 200 mg by mouth 2 (two) times daily. 07/02/18   [provider]  naproxen sodium (ALEVE) 220 MG tablet Take 220-440 mg by mouth 2 (two) times daily  as needed (for pain).    [provider]  OXYGEN Inhale 3 L into the lungs continuous.    [provider]    Physical Exam: Vitals:   01/30/20 1508 01/30/20 1601 01/30/20 1603  BP:  (!) 170/92   Pulse:  83   Resp:  (!) 26   Temp:   99.3 F (37.4 C)  TempSrc:   Oral  SpO2:  96%   Weight: 83 kg    Height: 5\' 3"  (1.6 m)      Vitals:   01/30/20 1508 01/30/20 1601 01/30/20 1603  BP:  (!) 170/92   Pulse:  83   Resp:  (!) 26   Temp:   99.3 F (37.4 C)  TempSrc:    Oral  SpO2:  96%   Weight: 83 kg    Height: 5\' 3"  (1.6 m)     General: deconditioned and ill looking appearing  Neurology: Awake, patient confused and disorientated, but able to follow commands, respond to her name and recognize her daughter at the bedside.  Positive rigidity, jerking movements of her head.  Head and Neck. Head normocephalic. Neck supple with no adenopathy or thyromegaly.   E ENT: mild pallor, no icterus, oral mucosa dry.  Cardiovascular: No JVD. S1-S2 present, rhythmic, no gallops, rubs, or murmurs. No lower extremity edema. Pulmonary: positive breath sounds bilaterally,  Gastrointestinal. Abdomen soft and non tender Skin. No rashes Musculoskeletal: no joint deformities    Labs on Admission: I have personally reviewed following labs and imaging studies  CBC: Recent Labs  Lab 01/24/20 0126 01/25/20 0339 01/26/20 0108 01/30/20 1628  WBC 3.2* 3.0* 3.3* 4.3  NEUTROABS  --   --   --  3.0  HGB 11.1* 10.8* 11.3* 9.9*  HCT 36.0 36.2 35.0* 34.2*  MCV 95.2 96.3 92.1 98.8  PLT 139* 133* 137* 178   Basic Metabolic Panel: Recent Labs  Lab 01/24/20 0126 01/25/20 0339 01/26/20 0108 01/30/20 1628  NA 135 137 138 138  K 4.0 4.7 4.4 5.3*  CL 95* 96* 99 95*  CO2 31 32 30 32  GLUCOSE 134* 136* 131* 242*  BUN 16 10 11  46*  CREATININE 0.83 0.85 0.83 1.76*  CALCIUM 8.4* 8.4* 8.5* 9.2   GFR: Estimated Creatinine Clearance: 30.3 mL/min (A) (by C-G formula based on SCr of 1.76 mg/dL (H)). Liver Function Tests: Recent Labs  Lab 01/30/20 1628  AST 30  ALT <5  ALKPHOS 169*  BILITOT 0.9  PROT 6.6  ALBUMIN 3.2*   No results for input(s): LIPASE, AMYLASE in the last 168 hours. No results for input(s): AMMONIA in the last 168 hours. Coagulation Profile: No results for input(s): INR, PROTIME in the last 168 hours. Cardiac Enzymes: No results for input(s): CKTOTAL, CKMB, CKMBINDEX, TROPONINI in the last 168 hours. BNP (last 3 results) No results for input(s):  PROBNP in the last 8760 hours. HbA1C: No results for input(s): HGBA1C in the last 72 hours. CBG: Recent Labs  Lab 01/25/20 2050 01/26/20 0724 01/26/20 1155 01/26/20 1345 01/26/20 1610  GLUCAP 209* 127* 248* 179* 134*   Lipid Profile: No results for input(s): CHOL, HDL, LDLCALC, TRIG, CHOLHDL, LDLDIRECT in the last 72 hours. Thyroid Function Tests: No results for input(s): TSH, T4TOTAL, FREET4, T3FREE, THYROIDAB in the last 72 hours. Anemia Panel: No results for input(s): VITAMINB12, FOLATE, FERRITIN, TIBC, IRON, RETICCTPCT in the last 72 hours. Urine analysis:    Component Value Date/Time   COLORURINE YELLOW 01/22/2020 1948   APPEARANCEUR HAZY (  A) 01/22/2020 1948   LABSPEC 1.020 01/22/2020 1948   PHURINE 5.0 01/22/2020 1948   GLUCOSEU NEGATIVE 01/22/2020 1948   HGBUR NEGATIVE 01/22/2020 1948   BILIRUBINUR NEGATIVE 01/22/2020 1948   KETONESUR NEGATIVE 01/22/2020 1948   PROTEINUR 30 (A) 01/22/2020 1948   UROBILINOGEN 1.0 12/02/2012 1907   NITRITE NEGATIVE 01/22/2020 1948   LEUKOCYTESUR NEGATIVE 01/22/2020 1948    Radiological Exams on Admission: CT Head Wo Contrast  Result Date: 01/30/2020 CLINICAL DATA:  Altered level of consciousness EXAM: CT HEAD WITHOUT CONTRAST TECHNIQUE: Contiguous axial images were obtained from the base of the skull through the vertex without intravenous contrast. COMPARISON:  01/22/2020 FINDINGS: Brain: Stable hypodensities within the periventricular white matter and bilateral basal ganglia consistent with chronic small vessel ischemic changes. No acute infarct or hemorrhage. Lateral ventricles and remaining midline structures are stable. No acute extra-axial fluid collections. No mass effect. Vascular: No hyperdense vessel or unexpected calcification. Skull: Normal. Negative for fracture or focal lesion. Sinuses/Orbits: Postsurgical changes from maxillary and ethmoid sinus surgery again noted. Chronic mucosal thickening throughout the ethmoid air cells.  Other: None IMPRESSION: 1. Stable chronic small vessel ischemic changes. No acute intracranial process. Electronically Signed   By: Sharlet Salina M.D.   On: 01/30/2020 17:21   DG Shoulder Left  Result Date: 01/30/2020 CLINICAL DATA:  Left lateral shoulder bruising after fall today EXAM: LEFT SHOULDER - 2+ VIEW COMPARISON:  None. FINDINGS: Frontal and transscapular views of the left shoulder are obtained. No acute displaced fracture, subluxation, or dislocation. Mild acromioclavicular and glenohumeral osteoarthritis. Prominent spurring along the undersurface of the acromion process. Left chest is clear. IMPRESSION: 1. Degenerative changes of the left shoulder.  No acute fracture. Electronically Signed   By: Sharlet Salina M.D.   On: 01/30/2020 16:21    EKG: Independently reviewed.  83 bpm, normal axis, right bundle branch block, sinus rhythm, no ST segment changes, negative T wave V1-V3  Assessment/Plan Principal Problem:   Altered mental state Active Problems:   TIA (transient ischemic attack)   Seizures (HCC)   Hypertension   Parkinson disease (HCC)   Acute CVA (cerebrovascular accident) (HCC)   AKI (acute kidney injury) (HCC)   Hyperkalemia   Type 2 diabetes mellitus (HCC)   CKD stage 2 due to type 2 diabetes mellitus (HCC)   69 year old female with a past medical history for Parkinson's disease, seizures, history of CVA and chronic kidney disease who presents from from skilled nursing facility with acute altered mentation 4 days after being discharged from the hospital.  Unclear the duration and timing of her symptoms, information unknown about her p.o. intake and functional but indirectly is known the patient was weak enough she could not go outside the skilled nursing facility for a family visit 2 days ago. On her initial physical examination she is confused and disoriented, her temperature 99.3, blood pressure 170/92, heart rate 83, respiratory rate 26, oxygen saturation 96% on 3 L  supplemental oxygen.  She has dry mucous membranes, her lungs are clear to auscultation, heart S1-S2, present rhythmic, her abdomen is protuberant but soft and nontender, lower extremities with no edema, no rashes. Sodium 138, potassium 5.3, chloride 95, bicarb 32, glucose 242, BUN 46, creatinine 1.76, AST 30, ALT less than 5, troponin I 205, white count 4.3, hemoglobin 9.9, hematocrit 34.2, platelets 178  Patient will be admitted to the hospital working diagnosis of acute altered mental status (acute toxic/metabolic encephalopathy) in the setting of acute kidney injury, hyper kalemia and troponin  elevation.  1.  Acute altered mentation/metabolic/toxic acute encephalopathy. Likely multifactorial, patient is nonfocal.  She does have Parkinson's disease and has been treated with carbidopa/levodopa.  She also takes lamotrigine, and gabapentin.  Continue neurochecks every 4 hours, remote telemetry monitoring, continue aspirin and blood pressure control.  Follow-up brain MRI.  For now continue carbidopa/levodopa and lamotrigine.  Hold on gabapentin, that patient was taking 800 mg 3 times daily. Add thiamine, multivitamins and will consult nutrition for further recommendations.  Physical therapy and Occupational and speech Therapy consultation. For now will order dysphagia 1 with aspiration precautions.   2.  Acute kidney injury on chronic kidney disease stage 2.  Patient has received 1 L of isotonic saline in the emergency department, will continue hydration with normal saline at 100 ml per hour. For hyperkalemia will order 3 doses of sodium zirconium.  Follow-up kidney function in the morning, avoid hypotension nephrotoxic agents. Close monitoring of urine output, strict ins and outs.  3.  Hypertension.  Continue atenolol.  4.  Asthma.  No signs of acute exacerbation, continue inhaled corticosteroids and long-acting/short acting beta agonist.  3.  Obesity class I.  BMI 32.4.  Status is:  Observation  The patient remains OBS appropriate and will d/c before 2 midnights.  Dispo: The patient is from: SNF              Anticipated d/c is to: SNF              Anticipated d/c date is: 1 day              Patient currently is not medically stable to d/c.   DVT prophylaxis: Enoxaparin   Code Status:   full  Family Communication:  I spoke with patient's daughter at the bedside, we talked in detail about patient's condition, plan of care and prognosis and all questions were addressed.     Consults called:  None Admission status:  Observation.    Breklyn Fabrizio Annett Gulaaniel Maryelizabeth Eberle MD [facility]   01/30/2020, 5:48 PM   \

## 2020-01-30 NOTE — Significant Event (Signed)
HOSPITAL MEDICINE OVERNIGHT EVENT NOTE  Initially called by nursing due to patient exhibiting extreme agitation, combatitiveness in the Ed. Chart reviewed.  Per nursing, patient not in pain, family already went home.  No foley cath in place.  Repeat troponin pending (initial was elevated).  Admission ECG reveals normal QTc, will try 2.5mg  IV Haldol.    UPDATE 10:30PM  Notified by nursing patient's mentation is changing.  I went to evaluate the patient at the bedside - patient lethargic, not following commands.  SpO2 in the mid 90's however patient wheezing on exam with labored breathing slightly.  Will get CXR, VBG.  Will give Albuterol.    UPDATE 11:30pm  CXR reveals no infiltrate.  Patient now breathing comfortably after treatment, saturating 100%.  No longer agitated.  Deno Lunger Altan Kraai

## 2020-01-30 NOTE — ED Provider Notes (Signed)
[place] EMERGENCY DEPARTMENT Provider Note  CSN: [id number] Arrival date & time: 01/30/20 1459    History Chief Complaint  Patient presents with  . Altered Mental Status    HPI  Sara Raymond is a 69 y.o. female with history of Parkinson's, seizures and baseline dysarthria brought to the ED from local SNF for evaluation of AMS. Per EMS, patient has not been acting right today (Monday), but last seen normal was reportedly last Friday. It is unclear what her status was over the weekend. She was previously living at home, but having increased frequency of falls and worsening dysarthria. Admitted last week for same, had an essentially normal workup, but Primidone was stopped due to concerns that this medicine was the cause of her symptoms. She was subsequently discharged to [facility] on Thursday of last week. I attempted to contact staff at her SNF but there was no answer.    Past Medical History:  Diagnosis Date  . Asthma   . Diabetes mellitus without complication (HCC)   . Hypertension   . Parkinson disease (HCC)   . Sarcoidosis   . Seizures (HCC)     Past Surgical History:  Procedure Laterality Date  . ABDOMINAL HYSTERECTOMY      Family History  Problem Relation Age of Onset  . Hypertension Other     Social History   Tobacco Use  . Smoking status: Never Smoker  . Smokeless tobacco: Never Used  Vaping Use  . Vaping Use: Never used  Substance Use Topics  . Alcohol use: No  . Drug use: No     Home Medications Prior to Admission medications   Medication Sig Start Date End Date Taking? Authorizing Provider  albuterol (PROAIR HFA) 108 (90 Base) MCG/ACT inhaler Inhale 1-2 puffs into the lungs every 6 (six) hours as needed for wheezing or shortness of breath.   Yes [provider]  aspirin EC 81 MG tablet Take 81 mg by mouth daily. Swallow whole.   Yes [provider]  atenolol (TENORMIN) 25 MG tablet Take 25 mg by mouth daily. 01/19/20   Yes [provider]  Carbidopa-Levodopa ER (RYTARY) 48.75-195 MG CPCR Take 1 capsule by mouth 3 (three) times daily.   Yes [provider]  Carbidopa-Levodopa ER (RYTARY) 61.25-245 MG CPCR Take 1 capsule by mouth 3 (three) times daily.   Yes [provider]  Fluticasone-Salmeterol (ADVAIR) 250-50 MCG/DOSE AEPB Inhale 1 puff into the lungs 2 (two) times daily as needed (shortness of breath/wheezing).   Yes [provider]  gabapentin (NEURONTIN) 800 MG tablet Take 800 mg by mouth 3 (three) times daily. 08/05/18  Yes [provider]  lamoTRIgine (LAMICTAL) 200 MG tablet Take 200 mg by mouth 2 (two) times daily. 07/02/18  Yes [provider]  naproxen sodium (ALEVE) 220 MG tablet Take 220-440 mg by mouth 2 (two) times daily as needed (for pain).   Yes [provider]  OXYGEN Inhale 3 L into the lungs continuous.   Yes [provider]     Allergies    Patient has no known allergies.   Review of Systems   Review of Systems Unable to assess due to mental status.    Physical Exam BP (!) 175/78   Pulse 86   Temp 99.3 F (37.4 C) (Oral)   Resp (!) 23   Ht 5\' 3"  (1.6 m)   Wt 83 kg   SpO2 98%   BMI 32.41 kg/m   Physical Exam Vitals  and nursing note reviewed.  Constitutional:      Appearance: Normal appearance.  HENT:     Head: Normocephalic and atraumatic.     Nose: Nose normal.     Mouth/Throat:     Mouth: Mucous membranes are moist.  Eyes:     Extraocular Movements: Extraocular movements intact.     Conjunctiva/sclera: Conjunctivae normal.  Cardiovascular:     Rate and Rhythm: Normal rate.  Pulmonary:     Effort: Pulmonary effort is normal.     Breath sounds: Normal breath sounds.  Abdominal:     General: Abdomen is flat.     Palpations: Abdomen is soft.     Tenderness: There is no abdominal tenderness.  Musculoskeletal:        General: No swelling. Normal range of motion.     Cervical back: Neck  supple.  Skin:    General: Skin is warm and dry.     Comments: Old bruising to L shoulder  Neurological:     General: No focal deficit present.     Mental Status: She is alert. She is disoriented.     Cranial Nerves: No cranial nerve deficit.     Motor: No weakness.     Comments: Oriented to person, DOB, but not place or date; she has essential tremor and a preference to lean to the right; otherwise no apparent focal neuro deficits  Psychiatric:        Mood and Affect: Mood normal.      ED Results / Procedures / Treatments   Labs (all labs ordered are listed, but only abnormal results are displayed) Labs Reviewed  COMPREHENSIVE METABOLIC PANEL - Abnormal; Notable for the following components:      Result Value   Potassium 5.3 (*)    Chloride 95 (*)    Glucose, Bld 242 (*)    BUN 46 (*)    Creatinine, Ser 1.76 (*)    Albumin 3.2 (*)    Alkaline Phosphatase 169 (*)    GFR calc non Af Amer 29 (*)    GFR calc Af Amer 33 (*)    All other components within normal limits  CBC WITH DIFFERENTIAL/PLATELET - Abnormal; Notable for the following components:   RBC 3.46 (*)    Hemoglobin 9.9 (*)    HCT 34.2 (*)    MCHC 28.9 (*)    Lymphs Abs 0.4 (*)    All other components within normal limits  URINALYSIS, ROUTINE W REFLEX MICROSCOPIC - Abnormal; Notable for the following components:   Color, Urine AMBER (*)    APPearance HAZY (*)    Ketones, ur 5 (*)    Protein, ur 100 (*)    Bacteria, UA MANY (*)    All other components within normal limits  TROPONIN I (HIGH SENSITIVITY) - Abnormal; Notable for the following components:   Troponin I (High Sensitivity) 205 (*)    All other components within normal limits  SARS CORONAVIRUS 2 BY RT PCR (HOSPITAL ORDER, PERFORMED IN [facility])  CBC  CREATININE, SERUM  BASIC METABOLIC PANEL  CBC  TROPONIN I (HIGH SENSITIVITY)    EKG EKG Interpretation  Date/Time:  Monday January 30 2020 16:17:30 EDT Ventricular Rate:  83 PR  Interval:  186 QRS Duration: 100 QT Interval:  364 QTC Calculation: 427 R Axis:   63 Text Interpretation: Normal sinus rhythm Incomplete right bundle branch block T wave abnormality, consider anterior ischemia Abnormal ECG No significant change since last tracing Confirmed  by Susy Frizzle [phone]) on 01/30/2020 4:36:02 PM   Radiology CT Head Wo Contrast  Result Date: 01/30/2020 CLINICAL DATA:  Altered level of consciousness EXAM: CT HEAD WITHOUT CONTRAST TECHNIQUE: Contiguous axial images were obtained from the base of the skull through the vertex without intravenous contrast. COMPARISON:  01/22/2020 FINDINGS: Brain: Stable hypodensities within the periventricular white matter and bilateral basal ganglia consistent with chronic small vessel ischemic changes. No acute infarct or hemorrhage. Lateral ventricles and remaining midline structures are stable. No acute extra-axial fluid collections. No mass effect. Vascular: No hyperdense vessel or unexpected calcification. Skull: Normal. Negative for fracture or focal lesion. Sinuses/Orbits: Postsurgical changes from maxillary and ethmoid sinus surgery again noted. Chronic mucosal thickening throughout the ethmoid air cells. Other: None IMPRESSION: 1. Stable chronic small vessel ischemic changes. No acute intracranial process. Electronically Signed   By: Sharlet Salina M.D.   On: 01/30/2020 17:21   MR BRAIN WO CONTRAST  Result Date: 01/30/2020 CLINICAL DATA:  Neuro deficit, acute, stroke suspected. EXAM: MRI HEAD WITHOUT CONTRAST TECHNIQUE: Multiplanar, multiecho pulse sequences of the brain and surrounding structures were obtained without intravenous contrast. COMPARISON:  Head CT 01/30/2020, brain MRI 01/22/2020. FINDINGS: The patient was unable to tolerate the full examination. As a result, only axial and coronal diffusion-weighted sequences as well as a moderately motion degraded sagittal T1 weighted sequence could be obtained. There is no evidence of  acute infarct. No focal marrow lesion is identified within the limitations of motion degradation on the T1 weighted sagittal sequence. IMPRESSION: The patient was unable to tolerate the full examination and only diffusion-weighted imaging as well as a moderately motion degraded sagittal T1 weighted sequence could be obtained. No evidence of acute infarct. Electronically Signed   By: Jackey Loge DO   On: 01/30/2020 19:25   DG Shoulder Left  Result Date: 01/30/2020 CLINICAL DATA:  Left lateral shoulder bruising after fall today EXAM: LEFT SHOULDER - 2+ VIEW COMPARISON:  None. FINDINGS: Frontal and transscapular views of the left shoulder are obtained. No acute displaced fracture, subluxation, or dislocation. Mild acromioclavicular and glenohumeral osteoarthritis. Prominent spurring along the undersurface of the acromion process. Left chest is clear. IMPRESSION: 1. Degenerative changes of the left shoulder.  No acute fracture. Electronically Signed   By: Sharlet Salina M.D.   On: 01/30/2020 16:21    Procedures Procedures  Medications Ordered in the ED Medications  aspirin EC tablet 81 mg (81 mg Oral Refused 01/30/20 2157)  atenolol (TENORMIN) tablet 25 mg (25 mg Oral Refused 01/30/20 2220)  Carbidopa-Levodopa ER 61.25-245 MG CPCR 1 capsule (has no administration in time range)  lamoTRIgine (LAMICTAL) tablet 200 mg (200 mg Oral Refused 01/30/20 2155)  albuterol (VENTOLIN HFA) 108 (90 Base) MCG/ACT inhaler 1-2 puff (has no administration in time range)  mometasone-formoterol (DULERA) 200-5 MCG/ACT inhaler 2 puff (has no administration in time range)  enoxaparin (LOVENOX) injection 40 mg (40 mg Subcutaneous Refused 01/30/20 2157)  acetaminophen (TYLENOL) tablet 650 mg (has no administration in time range)    Or  acetaminophen (TYLENOL) suppository 650 mg (has no administration in time range)  ondansetron (ZOFRAN) tablet 4 mg (has no administration in time range)    Or  ondansetron (ZOFRAN) injection 4  mg (has no administration in time range)  0.9 %  sodium chloride infusion ( Intravenous New Bag/Given 01/30/20 2127)  sodium zirconium cyclosilicate (LOKELMA) packet 5 g (5 g Oral Refused 01/30/20 2156)  thiamine tablet 100 mg (100 mg Oral Refused 01/30/20 2157)  folic  acid (FOLVITE) tablet 1 mg (1 mg Oral Refused 01/30/20 2155)  multivitamin with minerals tablet 1 tablet (1 tablet Oral Refused 01/30/20 2156)  haloperidol lactate (HALDOL) injection 2.5 mg (2.5 mg Intravenous Given 01/30/20 2213)  sodium chloride 0.9 % bolus 1,000 mL (0 mLs Intravenous Stopped 01/30/20 2103)     MDM Rules/Calculators/A&P MDM Patient with multiple medical problems sent from SNF for AMS of unclear etiology. RN reports on arrival she was more oriented and able to answer questions more clearly, despite her underlying dysarthria. On my evaluation just a few minutes later she is having more difficulty. She does not have a focal deficit on exam, dysarthria is apparently chronic.  ED Course  I have reviewed the triage vital signs and the nursing notes.  Pertinent labs & imaging results that were available during my care of the patient were reviewed by me and considered in my medical decision making (see chart for details).  Clinical Course as of Jan 30 2227  Mon Jan 30, 2020  1549 Spoke with the patient's daughter who is on the way to the ED and hopefully will be able to help determine what is are new symptoms and what is baseline for this patient. Regardless, I do not see a focal deficit, she was admitted for same last week and workup neg for stroke, LSW is not clear and unable to contact SNF staff. Will not proceed with acute stroke eval, not a candidate for tPA or intervention.    [CS]  1653 Patient's daughter is at bedside. Patient is able to recognize her and give her name, but slow to respond. Daughter states this is not her baseline but she has not been able to speak to patient the last few days at [facility].    [CS]  1713  CBC with mild anemia   [CS]  1716 CT images reviewed, no apparently acute process. Awaiting Rads interpretation.    [CS]  1728 Spoke with Dr. Amada Jupiter, Neurology, he recommends repeating MRI today to rule out acute stroke. If neg, symptoms may be due to Parkinson's and does not necessarily require admission from a Neurology standpoint.    [CS]  1735 CMP shows AKI, mildly elevated K. Will give IVF bolus, anticipate admission for further management. Trop also elevated of unclear significance. Will continue to trend.    [CS]  1746 Spoke with Dr. Lucillie Garfinkel, Hospitalist, who will evaluate for admission.    [CS]    Clinical Course User Index [CS] Pollyann Savoy, MD    Final Clinical Impression(s) / ED Diagnoses Final diagnoses:  Altered mental status, unspecified altered mental status type  Dysarthria  AKI (acute kidney injury) (HCC)  Elevated troponin level    Rx / DC Orders ED Discharge Orders    None       Pollyann Savoy, MD 01/30/20 2228

## 2020-01-30 NOTE — Significant Event (Signed)
HOSPITAL MEDICINE OVERNIGHT EVENT NOTE   Patient exhibiting extreme agitation, combatitiveness in the Ed. Chart reviewed.  Per nursing, patient not in pain, family already went home.  No foley cath in place.  Repeat troponin pending (initial was elevated).  Admission ECG reveals normal QTc, will try 2.5mg  IV Haldol.    Sara Raymond

## 2020-01-30 NOTE — ED Notes (Addendum)
2nd troponin could not be obtained by this rn due to patient having no good access. Phlebotomy has been called.

## 2020-01-31 ENCOUNTER — Inpatient Hospital Stay (HOSPITAL_COMMUNITY): Payer: Medicare Other

## 2020-01-31 DIAGNOSIS — J9622 Acute and chronic respiratory failure with hypercapnia: Secondary | ICD-10-CM | POA: Diagnosis present

## 2020-01-31 DIAGNOSIS — R296 Repeated falls: Secondary | ICD-10-CM | POA: Diagnosis present

## 2020-01-31 DIAGNOSIS — D631 Anemia in chronic kidney disease: Secondary | ICD-10-CM | POA: Diagnosis present

## 2020-01-31 DIAGNOSIS — Z7982 Long term (current) use of aspirin: Secondary | ICD-10-CM | POA: Diagnosis not present

## 2020-01-31 DIAGNOSIS — Z8673 Personal history of transient ischemic attack (TIA), and cerebral infarction without residual deficits: Secondary | ICD-10-CM | POA: Diagnosis not present

## 2020-01-31 DIAGNOSIS — R0609 Other forms of dyspnea: Secondary | ICD-10-CM | POA: Diagnosis not present

## 2020-01-31 DIAGNOSIS — G20 Parkinson's disease: Secondary | ICD-10-CM | POA: Diagnosis present

## 2020-01-31 DIAGNOSIS — I129 Hypertensive chronic kidney disease with stage 1 through stage 4 chronic kidney disease, or unspecified chronic kidney disease: Secondary | ICD-10-CM | POA: Diagnosis present

## 2020-01-31 DIAGNOSIS — N390 Urinary tract infection, site not specified: Secondary | ICD-10-CM | POA: Diagnosis present

## 2020-01-31 DIAGNOSIS — R471 Dysarthria and anarthria: Secondary | ICD-10-CM | POA: Diagnosis present

## 2020-01-31 DIAGNOSIS — J9621 Acute and chronic respiratory failure with hypoxia: Secondary | ICD-10-CM | POA: Diagnosis present

## 2020-01-31 DIAGNOSIS — N179 Acute kidney failure, unspecified: Secondary | ICD-10-CM | POA: Diagnosis present

## 2020-01-31 DIAGNOSIS — Z90710 Acquired absence of both cervix and uterus: Secondary | ICD-10-CM | POA: Diagnosis not present

## 2020-01-31 DIAGNOSIS — Z9981 Dependence on supplemental oxygen: Secondary | ICD-10-CM | POA: Diagnosis not present

## 2020-01-31 DIAGNOSIS — R0602 Shortness of breath: Secondary | ICD-10-CM | POA: Diagnosis present

## 2020-01-31 DIAGNOSIS — E1122 Type 2 diabetes mellitus with diabetic chronic kidney disease: Secondary | ICD-10-CM | POA: Diagnosis present

## 2020-01-31 DIAGNOSIS — R778 Other specified abnormalities of plasma proteins: Secondary | ICD-10-CM | POA: Diagnosis not present

## 2020-01-31 DIAGNOSIS — J45901 Unspecified asthma with (acute) exacerbation: Secondary | ICD-10-CM | POA: Diagnosis present

## 2020-01-31 DIAGNOSIS — Z8249 Family history of ischemic heart disease and other diseases of the circulatory system: Secondary | ICD-10-CM | POA: Diagnosis not present

## 2020-01-31 DIAGNOSIS — Z79899 Other long term (current) drug therapy: Secondary | ICD-10-CM | POA: Diagnosis not present

## 2020-01-31 DIAGNOSIS — N182 Chronic kidney disease, stage 2 (mild): Secondary | ICD-10-CM | POA: Diagnosis present

## 2020-01-31 DIAGNOSIS — R4182 Altered mental status, unspecified: Secondary | ICD-10-CM | POA: Diagnosis not present

## 2020-01-31 DIAGNOSIS — Z20822 Contact with and (suspected) exposure to covid-19: Secondary | ICD-10-CM | POA: Diagnosis present

## 2020-01-31 DIAGNOSIS — E875 Hyperkalemia: Secondary | ICD-10-CM | POA: Diagnosis present

## 2020-01-31 DIAGNOSIS — G9340 Encephalopathy, unspecified: Secondary | ICD-10-CM | POA: Diagnosis present

## 2020-01-31 DIAGNOSIS — I639 Cerebral infarction, unspecified: Secondary | ICD-10-CM | POA: Diagnosis not present

## 2020-01-31 DIAGNOSIS — E669 Obesity, unspecified: Secondary | ICD-10-CM | POA: Diagnosis present

## 2020-01-31 DIAGNOSIS — G92 Toxic encephalopathy: Secondary | ICD-10-CM | POA: Diagnosis present

## 2020-01-31 DIAGNOSIS — I4510 Unspecified right bundle-branch block: Secondary | ICD-10-CM | POA: Diagnosis present

## 2020-01-31 DIAGNOSIS — R569 Unspecified convulsions: Secondary | ICD-10-CM | POA: Diagnosis present

## 2020-01-31 LAB — BLOOD GAS, ARTERIAL
Acid-Base Excess: 7.9 mmol/L — ABNORMAL HIGH (ref 0.0–2.0)
Bicarbonate: 33.9 mmol/L — ABNORMAL HIGH (ref 20.0–28.0)
Drawn by: 427381
FIO2: 36
O2 Saturation: 94.4 %
Patient temperature: 37
pCO2 arterial: 67.9 mmHg (ref 32.0–48.0)
pH, Arterial: 7.319 — ABNORMAL LOW (ref 7.350–7.450)
pO2, Arterial: 74.6 mmHg — ABNORMAL LOW (ref 83.0–108.0)

## 2020-01-31 LAB — CBC
HCT: 32.7 % — ABNORMAL LOW (ref 36.0–46.0)
Hemoglobin: 9.7 g/dL — ABNORMAL LOW (ref 12.0–15.0)
MCH: 29.4 pg (ref 26.0–34.0)
MCHC: 29.7 g/dL — ABNORMAL LOW (ref 30.0–36.0)
MCV: 99.1 fL (ref 80.0–100.0)
Platelets: 153 10*3/uL (ref 150–400)
RBC: 3.3 MIL/uL — ABNORMAL LOW (ref 3.87–5.11)
RDW: 13.2 % (ref 11.5–15.5)
WBC: 3.7 10*3/uL — ABNORMAL LOW (ref 4.0–10.5)
nRBC: 0 % (ref 0.0–0.2)

## 2020-01-31 LAB — BASIC METABOLIC PANEL
Anion gap: 9 (ref 5–15)
BUN: 38 mg/dL — ABNORMAL HIGH (ref 8–23)
CO2: 33 mmol/L — ABNORMAL HIGH (ref 22–32)
Calcium: 8.9 mg/dL (ref 8.9–10.3)
Chloride: 98 mmol/L (ref 98–111)
Creatinine, Ser: 1.32 mg/dL — ABNORMAL HIGH (ref 0.44–1.00)
GFR calc Af Amer: 47 mL/min — ABNORMAL LOW (ref >60)
GFR calc non Af Amer: 41 mL/min — ABNORMAL LOW (ref >60)
Glucose, Bld: 172 mg/dL — ABNORMAL HIGH (ref 70–99)
Potassium: 4.7 mmol/L (ref 3.5–5.1)
Sodium: 140 mmol/L (ref 135–145)

## 2020-01-31 LAB — BLOOD GAS, VENOUS
Acid-Base Excess: 8.9 mmol/L — ABNORMAL HIGH (ref 0.0–2.0)
Bicarbonate: 36.3 mmol/L — ABNORMAL HIGH (ref 20.0–28.0)
Drawn by: 1338
FIO2: 28
O2 Saturation: 37.8 %
Patient temperature: 37
pCO2, Ven: 88.4 mmHg (ref 44.0–60.0)
pH, Ven: 7.237 — ABNORMAL LOW (ref 7.250–7.430)
pO2, Ven: <30 mmHg — CL (ref 32.0–45.0)

## 2020-01-31 LAB — ECHOCARDIOGRAM LIMITED
Height: 63 in
S' Lateral: 2.7 cm
Weight: 2927.71 oz

## 2020-01-31 LAB — TROPONIN I (HIGH SENSITIVITY): Troponin I (High Sensitivity): 236 ng/L (ref <18)

## 2020-01-31 MED ORDER — IPRATROPIUM-ALBUTEROL 0.5-2.5 (3) MG/3ML IN SOLN
3.0000 mL | Freq: Four times a day (QID) | RESPIRATORY_TRACT | Status: DC
  Administered 2020-01-31 – 2020-02-01 (×4): 3 mL via RESPIRATORY_TRACT
  Filled 2020-01-31 (×3): qty 3

## 2020-01-31 MED ORDER — METHYLPREDNISOLONE SODIUM SUCC 40 MG IJ SOLR
40.0000 mg | INTRAMUSCULAR | Status: DC
  Administered 2020-01-31 – 2020-02-01 (×2): 40 mg via INTRAVENOUS
  Filled 2020-01-31 (×4): qty 1

## 2020-01-31 MED ORDER — HALOPERIDOL LACTATE 5 MG/ML IJ SOLN
1.0000 mg | Freq: Four times a day (QID) | INTRAMUSCULAR | Status: DC | PRN
  Filled 2020-01-31: qty 1

## 2020-01-31 MED ORDER — ALBUTEROL SULFATE HFA 108 (90 BASE) MCG/ACT IN AERS: 1.0000 | INHALATION_SPRAY | Freq: Four times a day (QID) | RESPIRATORY_TRACT | Status: DC | PRN

## 2020-01-31 MED ORDER — IPRATROPIUM-ALBUTEROL 0.5-2.5 (3) MG/3ML IN SOLN
3.0000 mL | RESPIRATORY_TRACT | Status: DC | PRN
  Filled 2020-01-31: qty 3

## 2020-01-31 MED ORDER — POLYETHYLENE GLYCOL 3350 17 G PO PACK
17.0000 g | PACK | Freq: Every day | ORAL | Status: DC
  Administered 2020-01-31 – 2020-02-02 (×3): 17 g via ORAL
  Filled 2020-01-31 (×5): qty 1

## 2020-01-31 NOTE — Progress Notes (Signed)
PROGRESS NOTE    Sara Raymond  TKP:[id number] DOB: 02/03/1951 DOA: 01/30/2020 PCP: Angelica Chessman, MD    Brief Narrative:  Patient admitted to the hospital with the working diagnosis of acute altered mental status (acute toxic/metabolic encephalopathy) in the setting of acute kidney injury, hyper kalemia and troponin elevation. During hospitalization with worsening hypoxemia due to asthma exacerbation.   69 year old female with a past medical history for Parkinson's disease, seizures, history of CVA and chronic kidney disease who presents from from skilled nursing facility with acute altered mentation 4 days after being discharged from the hospital.  Unclear the duration and timing of her symptoms, information unknown about her p.o. intake and functional status but indirectly is known the patient was weak enough she could not go outside the skilled nursing facility for a family visit 2 days ago. On her initial physical examination she was confused and disoriented, her temperature 99.3, blood pressure 170/92, heart rate 83, respiratory rate 26, oxygen saturation 96% on 3 L supplemental oxygen.  She has dry mucous membranes, her lungs were clear to auscultation, heart S1-S2, present rhythmic, her abdomen was protuberant but soft and nontender, lower extremities with no edema, no rashes. Sodium 138, potassium 5.3, chloride 95, bicarb 32, glucose 242, BUN 46, creatinine 1.76, AST 30, ALT less than 5, troponin I 205, white count 4.3, hemoglobin 9.9, hematocrit 34.2, platelets 178. EKG  83 bpm, normal axis, right bundle branch block, sinus rhythm, no ST segment changes, negative T wave V1-V3  Patient placed on IV fluids and close neurologic monitoring. Further work up with brain MRI with no acute changes (limited study).  Overnight developed significant confusion and agitation along with worsening hypoxemia and wheezing. Chest film with no pulmonary edema,  Patient was placed on bronchodilator  therapy and received antipsychotic therapy.    Assessment & Plan:   Principal Problem:   Altered mental state Active Problems:   TIA (transient ischemic attack)   Seizures (HCC)   Hypertension   Parkinson disease (HCC)   Acute CVA (cerebrovascular accident) (HCC)   AKI (acute kidney injury) (HCC)   Hyperkalemia   Type 2 diabetes mellitus (HCC)   CKD stage 2 due to type 2 diabetes mellitus (HCC)    1.  Acute altered mentation/metabolic/toxic acute encephalopathy. Patient with worsening confusion last night required antipsychotic therapy with haloperidol. This  am is more awake and alert, following commands and answering simple questions.   Will continue pulmonary support, to keep oxygen saturation more than 92%/ Continue with thiamine, multivitamins and follow nutrition for further recommendations. Continue to hold on gabapentin.  Continue with lamotrigine 200 mg bid.   Patient passed [facility] swallow screen per speech therapy. Follow with PT and OT.   2. Acute asthma exacerbation with acute hypoxic and hypercapnic respiratory failure. This am continue to have dyspnea and wheezing. ABG with pH 7,31. PCO2 67.9. Pa02 74,6, bicarbonate 33,9. Chest film personally reviewed with congested hilar regions, but no infiltrates.  Continue supplemental 02 per [place], will add bronchodilator therapy with duoneb q 2 as needed and scheduled qid. Systemic steroids with methylprednisolone 40 mg IV q 24.  Discontinue IV fluids for now, follow a restrictive IV fluids strategy.  Continue with inhaled corticosteroids and LABA.   2.  Acute kidney injury on chronic kidney disease stage 2/ hyperkalemia.  renal function with serum cr down to 1,32, with K at 4,7 and serum bicarbonate 33.   Will hold on IV fluids, continue to encourage po intake. Follow with  renal function in am. Avoid hypotension and nephrotoxic medications. Hold on further sodium zirconium.   3.  Hypertension.  Blood pressure control with  atenolol.  4.  Obesity class I.  BMI 32.4. will need outpatient follow up.   5. Troponin elevation in the setting of decreased GFR. Troponin I 205, 209, 236. Patient with no chest pain, ekg with no ischemic changes (anterior t wave inversions are not new). Patient had an complete echocardiogram on 01/23/20 with preserved LV systolic function. Will order a limited study this time to rule out any focal wall motion abnormalities.  Continue aspirin for now.   Patient continue to be at high risk for worsening respiratory failure and encephalopathy   Status is: Observation  The patient will require care spanning > 2 midnights and should be moved to inpatient because: IV treatments appropriate due to intensity of illness or inability to take PO  Dispo: The patient is from: SNF              Anticipated d/c is to: SNF              Anticipated d/c date is: 3 days              Patient currently is not medically stable to d/c.   DVT prophylaxis: Enoxaparin   Code Status:   full  Family Communication:   I spoke over the phone with the patient's daugher about patient's  condition, plan of care, prognosis and all questions were addressed.    Subjective: Patient last night very confused and agitated, combative. Received haldol, this am is more calm and orientated, but continue to have dyspnea and wheezing.   Objective: Vitals:   01/31/20 0135 01/31/20 0745 01/31/20 0748 01/31/20 1110  BP:  130/66    Pulse:  70 69 (!) 104  Resp:  20 18 (!) 22  Temp:  98.6 F (37 C)    TempSrc:  Axillary    SpO2: 94% 98% 90% 90%  Weight:      Height:        Intake/Output Summary (Last 24 hours) at 01/31/2020 1112 Last data filed at 01/31/2020 0939 Gross per 24 hour  Intake 1858.35 ml  Output --  Net 1858.35 ml   Filed Weights   01/30/20 1508  Weight: 83 kg    Examination:   General: deconditioned and ill looking appearing  Neurology: Awake and alert, continue to have persistent tremors upper  extremities, non intentional.  E ENT: no pallor, no icterus, oral mucosa moist Cardiovascular: No JVD. S1-S2 present, rhythmic, no gallops, rubs, or murmurs. No lower extremity edema. Pulmonary:  Decreased breath sounds bilaterally, positive expiratory wheezing and decrease air movement.  Gastrointestinal. Abdomen mild distended, soft and non tender Skin. No rashes Musculoskeletal: no joint deformities     Data Reviewed: I have personally reviewed following labs and imaging studies  CBC: Recent Labs  Lab 01/25/20 0339 01/26/20 0108 01/30/20 1628 01/31/20 0150  WBC 3.0* 3.3* 4.3 3.7*  NEUTROABS  --   --  3.0  --   HGB 10.8* 11.3* 9.9* 9.7*  HCT 36.2 35.0* 34.2* 32.7*  MCV 96.3 92.1 98.8 99.1  PLT 133* 137* 178 153   Basic Metabolic Panel: Recent Labs  Lab 01/25/20 0339 01/26/20 0108 01/30/20 1628 01/31/20 0150  NA 137 138 138 140  K 4.7 4.4 5.3* 4.7  CL 96* 99 95* 98  CO2 32 30 32 33*  GLUCOSE 136* 131* 242* 172*  BUN 10  11 46* 38*  CREATININE 0.85 0.83 1.76* 1.32*  CALCIUM 8.4* 8.5* 9.2 8.9   GFR: Estimated Creatinine Clearance: 40.4 mL/min (A) (by C-G formula based on SCr of 1.32 mg/dL (H)). Liver Function Tests: Recent Labs  Lab 01/30/20 1628  AST 30  ALT <5  ALKPHOS 169*  BILITOT 0.9  PROT 6.6  ALBUMIN 3.2*   No results for input(s): LIPASE, AMYLASE in the last 168 hours. No results for input(s): AMMONIA in the last 168 hours. Coagulation Profile: No results for input(s): INR, PROTIME in the last 168 hours. Cardiac Enzymes: No results for input(s): CKTOTAL, CKMB, CKMBINDEX, TROPONINI in the last 168 hours. BNP (last 3 results) No results for input(s): PROBNP in the last 8760 hours. HbA1C: No results for input(s): HGBA1C in the last 72 hours. CBG: Recent Labs  Lab 01/25/20 2050 01/26/20 0724 01/26/20 1155 01/26/20 1345 01/26/20 1610  GLUCAP 209* 127* 248* 179* 134*   Lipid Profile: No results for input(s): CHOL, HDL, LDLCALC, TRIG,  CHOLHDL, LDLDIRECT in the last 72 hours. Thyroid Function Tests: No results for input(s): TSH, T4TOTAL, FREET4, T3FREE, THYROIDAB in the last 72 hours. Anemia Panel: No results for input(s): VITAMINB12, FOLATE, FERRITIN, TIBC, IRON, RETICCTPCT in the last 72 hours.    Radiology Studies: I have reviewed all of the imaging during this hospital visit personally     Scheduled Meds: . aspirin EC  81 mg Oral Daily  . atenolol  25 mg Oral Daily  . carbidopa-levodopa  1 tablet Oral TID  . enoxaparin (LOVENOX) injection  40 mg Subcutaneous Q24H  . folic acid  1 mg Oral Daily  . ipratropium-albuterol  3 mL Nebulization Q6H  . lamoTRIgine  200 mg Oral BID  . methylPREDNISolone (SOLU-MEDROL) injection  40 mg Intravenous Q24H  . mometasone-formoterol  2 puff Inhalation BID  . multivitamin with minerals  1 tablet Oral Daily  . sodium zirconium cyclosilicate  5 g Oral TID  . thiamine  100 mg Oral Daily   Continuous Infusions:   LOS: 0 days        Yussef Jorge Annett Gula, MD

## 2020-01-31 NOTE — Progress Notes (Addendum)
SLP Cancellation Note  Patient Details Name: Sara Raymond MRN: [id number] DOB: 1951/03/24   Cancelled treatment:       Reason Eval/Treat Not Completed: SLP screened, no needs identified, will sign off. BSE deferred - pt passed [employer] screen. RN reports no difficulty with po intake. Please reconsult if needs arise.  Of note, pt was seen for cognitive-linguistic evaluation 01/23/20. Pt scored 16/30 on SLUMS Examination, indicating neurocognitive impairment.   [name], [facility], CCC-SLP Speech Language Pathologist Office: [phone]  Leigh Aurora 01/31/2020, 9:07 AM

## 2020-01-31 NOTE — Evaluation (Signed)
Physical Therapy Evaluation Patient Details Name: Sara Raymond MRN: [id number] DOB: [birth year] Today's Date: 01/31/2020   History of Present Illness  Pt isa 69 y/o female with PMH of seizures, parkinson's, HTN, DM 2, CKD stage 2, CVA and asthma.  Presents from SNF with acute AMS 4 days after being discharged from the hospital. CT, MRI negative. +acute metabolic encephalopathy  Clinical Impression   Pt admitted with above diagnosis. Patient requires increased time for processing and to respond (verbally and motorically). She was able to nearly fully stand with 2 person assist, however unable to walk.  Pt currently with functional limitations due to the deficits listed below (see PT Problem List). Pt will benefit from skilled PT to increase their independence and safety with mobility to allow discharge to the venue listed below.       Follow Up Recommendations SNF;Supervision/Assistance - 24 hour    Equipment Recommendations  Other (comment) (TBD at next venue)    Recommendations for Other Services       Precautions / Restrictions Precautions Precautions: Fall Restrictions Weight Bearing Restrictions: No      Mobility  Bed Mobility Overal bed mobility: Needs Assistance Bed Mobility: Rolling;Supine to Sit;Sit to Sidelying Rolling: Mod assist   Supine to sit: Min assist;+2 for physical assistance;+2 for safety/equipment;HOB elevated   Sit to sidelying: +2 for physical assistance;HOB elevated;+2 for safety/equipment;Mod assist General bed mobility comments: pt transitoned to EOB with min assist +2 given increased time for motor planning and sequencing, returned to bed via sidelying to supine with mod assist +2 for trunk and LB support  Transfers Overall transfer level: Needs assistance Equipment used: 2 person hand held assist Transfers: Sit to/from Stand Sit to Stand: Max assist;+2 physical assistance;+2 safety/equipment         General transfer comment: 2 trials for  max assist +2 to power up and steady, decreased tolerance and cueing for upright posture   Ambulation/Gait                Stairs            Wheelchair Mobility    Modified Rankin (Stroke Patients Only)       Balance Overall balance assessment: Needs assistance Sitting-balance support: No upper extremity supported;Feet supported Sitting balance-Leahy Scale: Poor Sitting balance - Comments: requires min-mod for static sitting balance, R lateral lean  Postural control: Right lateral lean Standing balance support: Bilateral upper extremity supported;During functional activity Standing balance-Leahy Scale: Zero Standing balance comment: max support +2 to maintain standing balance                              Pertinent Vitals/Pain Pain Assessment: Faces Faces Pain Scale: Hurts little more Pain Location: R wrist Pain Descriptors / Indicators: Discomfort Pain Intervention(s): Limited activity within patient's tolerance;Monitored during session;Repositioned    Home Living Family/patient expects to be discharged to:: Skilled nursing facility Living Arrangements: Spouse/significant other Available Help at Discharge: Family Type of Home: House Home Access: Stairs to enter Entrance Stairs-Rails: Can reach both Entrance Stairs-Number of Steps: 2 Home Layout: One level Home Equipment: Walker - 2 wheels;Tub bench Additional Comments: admitted from SNF (recently admitted to SNF from hospital)    Prior Function Level of Independence: Needs assistance         Comments: prior to recent admission independent using RW, requiring increased assist since dc to SNF on 8/12     Hand Dominance   Dominant Hand:  Right    Extremity/Trunk Assessment   Upper Extremity Assessment Upper Extremity Assessment: Defer to OT evaluation    Lower Extremity Assessment Lower Extremity Assessment: Generalized weakness    Cervical / Trunk Assessment Cervical / Trunk  Assessment: Kyphotic  Communication   Communication: Receptive difficulties;Expressive difficulties  Cognition Arousal/Alertness: Awake/alert Behavior During Therapy: WFL for tasks assessed/performed Overall Cognitive Status: Impaired/Different from baseline Area of Impairment: Orientation;Attention;Memory;Following commands;Safety/judgement;Awareness;Problem solving                 Orientation Level: Disoriented to;Situation (place and month with 2 choices ) Current Attention Level: Focused Memory: Decreased recall of precautions;Decreased short-term memory Following Commands: Follows one step commands inconsistently;Follows one step commands with increased time Safety/Judgement: Decreased awareness of safety;Decreased awareness of deficits Awareness: Intellectual Problem Solving: Slow processing;Requires verbal cues;Decreased initiation;Difficulty sequencing;Requires tactile cues General Comments: patient able to orient self with 2 choices for month and place (but unable to respond with 3 choices), follows 1 step commands inconsistently with increased time, slow processing and poor problem solving       General Comments General comments (skin integrity, edema, etc.): VSS on 4L, pt reports wearing O2 at [place]; incr time for all verbal or motor responses    Exercises     Assessment/Plan    PT Assessment Patient needs continued PT services  PT Problem List Decreased strength;Decreased balance;Decreased coordination;Decreased mobility;Decreased knowledge of use of DME;Decreased knowledge of precautions;Decreased activity tolerance;Decreased cognition       PT Treatment Interventions Gait training;DME instruction;Functional mobility training;Therapeutic activities;Balance training;Therapeutic exercise;Patient/family education;Neuromuscular re-education;Cognitive remediation    PT Goals (Current goals can be found in the Care Plan section)  Acute Rehab PT Goals Patient Stated  Goal: none stated PT Goal Formulation: With patient Time For Goal Achievement: 02/14/20 Potential to Achieve Goals: Good    Frequency Min 2X/week   Barriers to discharge        Co-evaluation PT/OT/SLP Co-Evaluation/Treatment: Yes Reason for Co-Treatment: Necessary to address cognition/behavior during functional activity;Complexity of the patient's impairments (multi-system involvement) PT goals addressed during session: Mobility/safety with mobility;Balance OT goals addressed during session: ADL's and self-care       AM-PAC PT "6 Clicks" Mobility  Outcome Measure Help needed turning from your back to your side while in a flat bed without using bedrails?: A Lot Help needed moving from lying on your back to sitting on the side of a flat bed without using bedrails?: A Lot Help needed moving to and from a bed to a chair (including a wheelchair)?: A Lot Help needed standing up from a chair using your arms (e.g., wheelchair or bedside chair)?: A Lot Help needed to walk in hospital room?: Total Help needed climbing 3-5 steps with a railing? : Total 6 Click Score: 10    End of Session Equipment Utilized During Treatment: Gait belt Activity Tolerance: Patient tolerated treatment well Patient left: in bed;with call bell/phone within reach;with bed alarm set Nurse Communication: Mobility status PT Visit Diagnosis: Unsteadiness on feet (R26.81);Muscle weakness (generalized) (M62.81);History of falling (Z91.81)    Time: 1517-6160 PT Time Calculation (min) (ACUTE ONLY): 35 min   Charges:   PT Evaluation $PT Eval Low Complexity: 1 Low           Jerolyn Center, PT Pager [phone]   Sara Raymond 01/31/2020, 1:16 PM

## 2020-01-31 NOTE — Progress Notes (Signed)
  CRITICAL VALUE   PCO2   88.4 PO2   < 30.0  MD made aware.

## 2020-01-31 NOTE — Evaluation (Signed)
Occupational Therapy Evaluation Patient Details Name: Sara Raymond MRN: [id number] DOB: [birth year] Today's Date: 01/31/2020    History of Present Illness Pt isa 69 y/o female with PMH of seizures, parkinson's, HTN, DM 2, CKD stage 2, CVA and asthma.  Presents from SNF with acute AMS 4 days after being discharged from the hospital. CT, MRI negative.    Clinical Impression   Patient admitted for SNF after recent hospitalization, dc'd on 01/26/20, presenting for above. Patient with limited verbalizations during session, but reports receiving therapy at SNF recently. Anticipate needing assist for ADLs, mobility but prior to august admission was independent with RW.  Currently requires min-total assist +2 for ADLs, min-mod assist +2 for bed mobility and max assist +2 for sit to stand transfers.  She requires min to mod assist for sitting balance, with R lateral lean; able to correct with multimodal cueing, but unable to sustain.  Patient oriented to self and year, month and place with 2 choices but no oriented to situation; follows 1 step commands inconsistently with increased time and poor awareness to deficits.  She will benefit from further OT services while admitted and after dc at SNF level to optimize return to PLOF and decrease burden of care with ADLs, IADLs and mobility.      Follow Up Recommendations  SNF;Supervision/Assistance - 24 hour    Equipment Recommendations  Other (comment) (TBD at next venue of care )    Recommendations for Other Services Speech consult     Precautions / Restrictions Precautions Precautions: Fall Restrictions Weight Bearing Restrictions: No      Mobility Bed Mobility Overal bed mobility: Needs Assistance Bed Mobility: Rolling;Supine to Sit;Sit to Sidelying Rolling: Mod assist   Supine to sit: Min assist;+2 for physical assistance;+2 for safety/equipment;HOB elevated   Sit to sidelying: +2 for physical assistance;HOB elevated;+2 for  safety/equipment;Mod assist General bed mobility comments: pt transitoned to EOB with min assist +2 given increased time for motor planning and sequencing, returned to bed via sidelying to supine with mod assist +2 for trunk and LB support  Transfers Overall transfer level: Needs assistance Equipment used: 2 person hand held assist Transfers: Sit to/from Stand Sit to Stand: Max assist;+2 physical assistance;+2 safety/equipment         General transfer comment: 2 trials for max assist +2 to power up and stedy, decreased tolerance and cueing for upright posture     Balance Overall balance assessment: Needs assistance Sitting-balance support: No upper extremity supported;Feet supported Sitting balance-Leahy Scale: Poor Sitting balance - Comments: requires min-mod for static sitting balance, R lateral lean  Postural control: Right lateral lean Standing balance support: Bilateral upper extremity supported;During functional activity Standing balance-Leahy Scale: Zero Standing balance comment: max support +2 to maintain standing balance                            ADL either performed or assessed with clinical judgement   ADL Overall ADL's : Needs assistance/impaired     Grooming: Minimal assistance;Sitting;Wash/dry face Grooming Details (indicate cue type and reason): min assist to stablize L hand while washing face, requires min assist to maintain balance as well Upper Body Bathing: Maximal assistance;Sitting   Lower Body Bathing: Maximal assistance;+2 for physical assistance;+2 for safety/equipment;Sit to/from stand   Upper Body Dressing : Maximal assistance;Sitting   Lower Body Dressing: Total assistance;+2 for physical assistance;+2 for safety/equipment;Sit to/from Market researcher Details (indicate cue type and reason):  deferred         Functional mobility during ADLs: Maximal assistance;+2 for physical assistance;+2 for safety/equipment General ADL  Comments: pt limited by cognition, weakness, impaired balance      Vision Baseline Vision/History: Cataracts Patient Visual Report: No change from baseline;Blurring of vision;Other (comment)       Perception     Praxis      Pertinent Vitals/Pain Pain Assessment: Faces Faces Pain Scale: Hurts little more Pain Location: R wrist Pain Descriptors / Indicators: Discomfort Pain Intervention(s): Limited activity within patient's tolerance;Monitored during session;Repositioned     Hand Dominance Right   Extremity/Trunk Assessment Upper Extremity Assessment Upper Extremity Assessment: Generalized weakness (BUE tremors at baseline )   Lower Extremity Assessment Lower Extremity Assessment: Defer to PT evaluation   Cervical / Trunk Assessment Cervical / Trunk Assessment: Kyphotic   Communication Communication Communication: Receptive difficulties;Expressive difficulties   Cognition Arousal/Alertness: Awake/alert Behavior During Therapy: WFL for tasks assessed/performed Overall Cognitive Status: Impaired/Different from baseline Area of Impairment: Orientation;Attention;Memory;Following commands;Safety/judgement;Awareness;Problem solving                 Orientation Level: Disoriented to;Situation (place and month with 2 choices ) Current Attention Level: Focused Memory: Decreased recall of precautions;Decreased short-term memory Following Commands: Follows one step commands inconsistently;Follows one step commands with increased time Safety/Judgement: Decreased awareness of safety;Decreased awareness of deficits Awareness: Intellectual Problem Solving: Slow processing;Requires verbal cues;Decreased initiation;Difficulty sequencing;Requires tactile cues General Comments: patient able to orient self with 2 choices for month and place (but unable to respond with 3 choices), follows 1 step commands inconsistently with increased time, slow processing and poor problem solving     General Comments  VSS on 4L, pt reports wearing O2 at [place]     Exercises     Shoulder Instructions      Home Living Family/patient expects to be discharged to:: Skilled nursing facility                                        Prior Functioning/Environment Level of Independence: Needs assistance        Comments: prior to recent admission independent using RW, requiring increased assist since dc to SNF on 8/12        OT Problem List: Decreased strength;Decreased activity tolerance;Impaired balance (sitting and/or standing);Impaired vision/perception;Decreased coordination;Decreased cognition;Decreased safety awareness;Decreased knowledge of use of DME or AE;Decreased knowledge of precautions;Obesity      OT Treatment/Interventions: Self-care/ADL training;Energy conservation;DME and/or AE instruction;Cognitive remediation/compensation;Therapeutic activities;Visual/perceptual remediation/compensation;Patient/family education;Balance training;Therapeutic exercise    OT Goals(Current goals can be found in the care plan section) Acute Rehab OT Goals Patient Stated Goal: none stated OT Goal Formulation: Patient unable to participate in goal setting Time For Goal Achievement: 02/14/20 Potential to Achieve Goals: Fair  OT Frequency: Min 2X/week   Barriers to D/C:            Co-evaluation PT/OT/SLP Co-Evaluation/Treatment: Yes Reason for Co-Treatment: Necessary to address cognition/behavior during functional activity;For patient/therapist safety;To address functional/ADL transfers   OT goals addressed during session: ADL's and self-care      AM-PAC OT "6 Clicks" Daily Activity     Outcome Measure Help from another person eating meals?: A Lot Help from another person taking care of personal grooming?: A Lot Help from another person toileting, which includes using toliet, bedpan, or urinal?: Total Help from another person bathing (including washing, rinsing,  drying)?: A Lot  Help from another person to put on and taking off regular upper body clothing?: A Lot Help from another person to put on and taking off regular lower body clothing?: Total 6 Click Score: 10   End of Session Equipment Utilized During Treatment: Oxygen;Gait belt Nurse Communication: Mobility status  Activity Tolerance: Patient tolerated treatment well Patient left: in bed;with call bell/phone within reach;with bed alarm set (bed in chair position)  OT Visit Diagnosis: Other abnormalities of gait and mobility (R26.89);Muscle weakness (generalized) (M62.81);Other symptoms and signs involving cognitive function                Time: 9244-6286 OT Time Calculation (min): 30 min Charges:  OT General Charges $OT Visit: 1 Visit OT Evaluation $OT Eval Moderate Complexity: 1 Mod  Barry Brunner, OT Acute Rehabilitation Services Pager [phone] Office [phone]   Chancy Milroy 01/31/2020, 11:39 AM

## 2020-01-31 NOTE — Progress Notes (Signed)
°  Echocardiogram 2D Echocardiogram has been performed.  Sara Raymond 01/31/2020, 2:24 PM

## 2020-01-31 NOTE — NC FL2 (Signed)
[place] MEDICAID FL2 LEVEL OF CARE SCREENING TOOL     IDENTIFICATION  Patient Name: Sara Raymond Birthdate: Nov 04, 1950 Sex: female Admission Date (Current Location): 01/30/2020  [facility] and [place] Number:  Producer, television/film/video and Address:  [facility] [place], [address] [place], [place] [address]      Provider Number: [id number]  Attending Physician Name and Address:  Coralie Keens,*  Relative Name and Phone Number:  Alanna Storti, spouse, [phone]    Current Level of Care: Hospital Recommended Level of Care: Skilled Nursing Facility Prior Approval Number:    Date Approved/Denied:   PASRR Number: [id number]  Discharge Plan: SNF    Current Diagnoses: Patient Active Problem List   Diagnosis Date Noted  . Encephalopathy 01/31/2020  . Altered mental state 01/30/2020  . AKI (acute kidney injury) (HCC) 01/30/2020  . Hyperkalemia 01/30/2020  . Type 2 diabetes mellitus (HCC) 01/30/2020  . CKD stage 2 due to type 2 diabetes mellitus (HCC) 01/30/2020  . Generalized weakness 01/22/2020  . Acute CVA (cerebrovascular accident) (HCC) 08/23/2018  . TIA (transient ischemic attack) 08/22/2018  . Seizures (HCC) 08/22/2018  . Hypertension 08/22/2018  . 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 CKD (chronic kidney disease), stage III 08/22/2018  . Parkinson disease (HCC) 08/22/2018    Orientation RESPIRATION BLADDER Height & Weight     Self, Place  O2 (see d/c summary for oxygen requirements) Incontinent Weight: 83 kg Height:  5\' 3"  (160 cm)  BEHAVIORAL SYMPTOMS/MOOD NEUROLOGICAL BOWEL NUTRITION STATUS      Continent Diet (heart healthy/ carb mofidied with thin liquids)  AMBULATORY STATUS COMMUNICATION OF NEEDS Skin   Extensive Assist Verbally Other (Comment) (skin tear to lt arm/ bruising to bilat: knee/flank/thigh/ arm/ shoulder)                       Personal Care Assistance Level  of Assistance  Bathing, Feeding, Dressing Bathing Assistance: Maximum assistance Feeding assistance: Maximum assistance Dressing Assistance: Maximum assistance     Functional Limitations Info  Sight, Hearing, Speech Sight Info: Adequate Hearing Info: Adequate Speech Info: Adequate    SPECIAL CARE FACTORS FREQUENCY  PT (By licensed PT), OT (By licensed OT), Speech therapy     PT Frequency: 5x/wk OT Frequency: 5x/wk     Speech Therapy Frequency: 5x/wk      Contractures Contractures Info: Not present    Additional Factors Info  Code Status, Allergies, Psychotropic, Insulin Sliding Scale Code Status Info: Full Allergies Info: NKA Psychotropic Info: Sinemet 50-200 three times a day/ Lamictal 200 BiD         Current Medications (01/31/2020):  This is the current hospital active medication list Current Facility-Administered Medications  Medication Dose Route Frequency Provider Last Rate Last Admin  . acetaminophen (TYLENOL) tablet 650 mg  650 mg Oral Q6H PRN Arrien, 02/02/2020, MD       Or  . acetaminophen (TYLENOL) suppository 650 mg  650 mg Rectal Q6H PRN Arrien, York Ram, MD      . aspirin EC tablet 81 mg  81 mg Oral Daily York Ram, MD   81 mg at 01/31/20 1033  . atenolol (TENORMIN) tablet 25 mg  25 mg Oral Daily Arrien, 02/02/20, MD   25 mg at 01/31/20 1033  . carbidopa-levodopa (SINEMET CR) 50-200 MG per tablet controlled release 1 tablet  1 tablet Oral TID Arrien, 02/02/20, MD  1 tablet at 01/31/20 1139  . enoxaparin (LOVENOX) injection 40 mg  40 mg Subcutaneous Q24H Arrien, York Ram, MD      . folic acid (FOLVITE) tablet 1 mg  1 mg Oral Daily Arrien, York Ram, MD   1 mg at 01/31/20 1033  . haloperidol lactate (HALDOL) injection 1 mg  1 mg Intramuscular Q6H PRN Arrien, York Ram, MD      . ipratropium-albuterol (DUONEB) 0.5-2.5 (3) MG/3ML nebulizer solution 3 mL  3 mL Nebulization Q6H Arrien, York Ram, MD    3 mL at 01/31/20 1110  . ipratropium-albuterol (DUONEB) 0.5-2.5 (3) MG/3ML nebulizer solution 3 mL  3 mL Nebulization Q2H PRN Arrien, York Ram, MD      . lamoTRIgine (LAMICTAL) tablet 200 mg  200 mg Oral BID Coralie Keens, MD   200 mg at 01/31/20 1033  . methylPREDNISolone sodium succinate (SOLU-MEDROL) 40 mg/mL injection 40 mg  40 mg Intravenous Q24H Coralie Keens, MD   40 mg at 01/31/20 1320  . mometasone-formoterol (DULERA) 200-5 MCG/ACT inhaler 2 puff  2 puff Inhalation BID Arrien, York Ram, MD   2 puff at 01/31/20 0747  . multivitamin with minerals tablet 1 tablet  1 tablet Oral Daily Arrien, York Ram, MD   1 tablet at 01/31/20 1033  . ondansetron (ZOFRAN) tablet 4 mg  4 mg Oral Q6H PRN Arrien, York Ram, MD       Or  . ondansetron [facility]) injection 4 mg  4 mg Intravenous Q6H PRN Arrien, York Ram, MD      . thiamine tablet 100 mg  100 mg Oral Daily Arrien, York Ram, MD   100 mg at 01/31/20 1033     Discharge Medications: Please see discharge summary for a list of discharge medications.  Relevant Imaging Results:  Relevant Lab Results:   Additional Information SSN [id number]  Kermit Balo, RN

## 2020-02-01 DIAGNOSIS — I639 Cerebral infarction, unspecified: Secondary | ICD-10-CM

## 2020-02-01 DIAGNOSIS — E1122 Type 2 diabetes mellitus with diabetic chronic kidney disease: Secondary | ICD-10-CM

## 2020-02-01 DIAGNOSIS — R4182 Altered mental status, unspecified: Secondary | ICD-10-CM

## 2020-02-01 DIAGNOSIS — N179 Acute kidney failure, unspecified: Secondary | ICD-10-CM

## 2020-02-01 DIAGNOSIS — N182 Chronic kidney disease, stage 2 (mild): Secondary | ICD-10-CM

## 2020-02-01 LAB — BASIC METABOLIC PANEL
Anion gap: 8 (ref 5–15)
BUN: 19 mg/dL (ref 8–23)
CO2: 35 mmol/L — ABNORMAL HIGH (ref 22–32)
Calcium: 9.2 mg/dL (ref 8.9–10.3)
Chloride: 94 mmol/L — ABNORMAL LOW (ref 98–111)
Creatinine, Ser: 0.82 mg/dL (ref 0.44–1.00)
GFR calc Af Amer: >60 mL/min (ref >60)
GFR calc non Af Amer: >60 mL/min (ref >60)
Glucose, Bld: 193 mg/dL — ABNORMAL HIGH (ref 70–99)
Potassium: 4.4 mmol/L (ref 3.5–5.1)
Sodium: 137 mmol/L (ref 135–145)

## 2020-02-01 LAB — TROPONIN I (HIGH SENSITIVITY): Troponin I (High Sensitivity): 136 ng/L (ref <18)

## 2020-02-01 MED ORDER — CEPHALEXIN 500 MG PO CAPS
500.0000 mg | ORAL_CAPSULE | Freq: Two times a day (BID) | ORAL | Status: DC
  Administered 2020-02-01 – 2020-02-04 (×7): 500 mg via ORAL
  Filled 2020-02-01 (×7): qty 1

## 2020-02-01 MED ORDER — ENSURE ENLIVE PO LIQD
237.0000 mL | Freq: Two times a day (BID) | ORAL | Status: DC
  Administered 2020-02-01 – 2020-02-04 (×7): 237 mL via ORAL

## 2020-02-01 NOTE — Progress Notes (Signed)
Initial Nutrition Assessment  DOCUMENTATION CODES:   Obesity unspecified  INTERVENTION:  Ensure Enlive po BID, each supplement provides 350 kcal and 20 grams of protein   NUTRITION DIAGNOSIS:   Predicted suboptimal nutrient intake related to lethargy/confusion as evidenced by other (comment) (per MD).    GOAL:   Patient will meet greater than or equal to 90% of their needs    MONITOR:   PO intake, Supplement acceptance, Labs, Weight trends, I & O's  REASON FOR ASSESSMENT:   Consult Assessment of nutrition requirement/status  ASSESSMENT:   Pt admitted with acute AMS/metabolic/toxic encephalopathy. PMH includes Parkinson's disease , seizures, HTN, type 2 DM, CKD stage II, asthma. Overnight, pt developed significant confusion and agitation along with worsening hypoxemia and wheezing.   Pt unavailable at time of RD visit.   Reviewed wt history. No significant wt changes noted.   PO Intake: 75% x 1 recorded meal  Labs reviewed. Medications: Folvite, Solu-medrol, MVI, Miralax, Thiamine  NUTRITION - FOCUSED PHYSICAL EXAM:  Unable to perform at this time. Will attempt at follow-up.   Diet Order:   Diet Order            Diet heart healthy/carb modified Room service appropriate? Yes; Fluid consistency: Thin  Diet effective now                 EDUCATION NEEDS:   No education needs have been identified at this time  Skin:  Skin Assessment: Skin Integrity Issues: Skin Integrity Issues:: Other (Comment) Other: skin tear L arm  Last BM:  8/17 type 2  Height:   Ht Readings from Last 1 Encounters:  01/30/20 5\' 3"  (1.6 m)    Weight:   Wt Readings from Last 1 Encounters:  01/30/20 83 kg    BMI:  Body mass index is 32.41 kg/m.  Estimated Nutritional Needs:   Kcal:  1550-1750  Protein:  95-105 grams  Fluid:  2L/d    02/01/20, MS, RD, LDN RD pager number and weekend/on-call pager number located in Amion.

## 2020-02-01 NOTE — Progress Notes (Signed)
PROGRESS NOTE  [facility] ZOX:[id number] DOB: 06/01/1951 DOA: 01/30/2020 PCP: Angelica ChessmanAguiar, Rafaela M, MD   LOS: 1 day   Brief Narrative / Interim history: 69 year old female with history of Parkinson's disease, seizure disorder, prior CVA, chronic kidney disease who presents to the hospital from her SNF due to altered mental status.  Of note, she was admitted on 8/8 and discharged on 8/12 for ataxia, dysarthria, neurology evaluated patient and an MRI was negative for acute findings, it showed only chronic infarcts.  Neurology felt like her presentation may have been due to medications, and her primidone has been discontinued.  Shortly after admission she also had agitation as well as worsening hypoxemia and felt to be wheezing in the setting of her underlying asthma.  On admission she was found to have acute kidney injury with a creatinine of 1.7, UA with many bacteria and an elevated high-sensitivity troponin around 200.  An MRI on admission was negative for acute infarct.  Subjective / 24h Interval events: She denies any complaints this morning for me.  Denies any chest pain.  Seems to be alert and appropriate  Assessment & Plan: Principal Problem Acute metabolic encephalopathy-unclear precipitant, MRI on admission was negative for acute findings.  She does have some evidence of a UTI, I would empirically start antibiotics this morning monitor.  She appears to be more alert.  Active Problems Acute asthma exacerbation with acute on chronic hypoxic and hypercapnic respiratory failure-8/17 was noted to have wheezing, she was started on nebulizers along with Solu-Medrol.  Continue for now.  Patient tells me that she does have oxygen at home.  Acute kidney injury on chronic kidney disease stage 2/ hyperkalemia-possibly dehydrated on admission, creatinine was up to 1.7, it has now normalized.  Hypertension-continue atenolol  Parkinson's disease-continue carbidopa-levodopa  Prior CVA-MRI  obtained on 8/8 multiple old basal ganglia small vessel infarcts.  Continue aspirin.  Possible UTI-urine culture sent.  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 will need outpatient follow up.   Elevated troponin-in the setting of decreased GFR, in the low 200s, flat, no chest pain.  EKG without ischemic changes, 2D echo without WMA.  On aspirin.   Scheduled Meds: . aspirin EC  81 mg Oral Daily  . atenolol  25 mg Oral Daily  . carbidopa-levodopa  1 tablet Oral TID  . cephALEXin  500 mg Oral Q12H  . enoxaparin (LOVENOX) injection  40 mg Subcutaneous Q24H  . feeding supplement (ENSURE ENLIVE)  237 mL Oral BID BM  . folic acid  1 mg Oral Daily  . ipratropium-albuterol  3 mL Nebulization Q6H  . lamoTRIgine  200 mg Oral BID  . methylPREDNISolone (SOLU-MEDROL) injection  40 mg Intravenous Q24H  . mometasone-formoterol  2 puff Inhalation BID  . multivitamin with minerals  1 tablet Oral Daily  . polyethylene glycol  17 g Oral Daily  . thiamine  100 mg Oral Daily   Continuous Infusions: PRN Meds:.acetaminophen **OR** acetaminophen, haloperidol lactate, ipratropium-albuterol, ondansetron **OR** ondansetron (ZOFRAN) IV  Diet Orders (From admission, onward)    Start     Ordered   01/31/20 1138  Diet heart healthy/carb modified Room service appropriate? Yes; Fluid consistency: Thin  Diet effective now       Question Answer Comment  Diet-HS Snack? Nothing   Room service appropriate? Yes   Fluid consistency: Thin      01/31/20 1143          DVT prophylaxis: enoxaparin (LOVENOX) injection 40 mg Start:  01/30/20 2200 SCDs Start: 01/30/20 2111     Code Status: Full Code  Family Communication: will call daughter  Status is: Inpatient  Remains inpatient appropriate because:Inpatient level of care appropriate due to severity of illness   Dispo: The patient is from: SNF              Anticipated d/c is to: SNF              Anticipated d/c date is: 2 days               Patient currently is not medically stable to d/c.  Consultants:  None   Procedures:  2D echo  Microbiology  Urine cultures - pending  Antimicrobials: Keflex 8/18 >>    Objective: Vitals:   02/01/20 0130 02/01/20 0437 02/01/20 0700 02/01/20 0817  BP:  (!) 166/86 (!) 162/85   Pulse:  92 96   Resp:  17 15   Temp:  98.2 F (36.8 C) 98.2 F (36.8 C)   TempSrc:   Oral   SpO2: 98% 100% 100% 98%  Weight:      Height:        Intake/Output Summary (Last 24 hours) at 02/01/2020 1026 Last data filed at 02/01/2020 0600 Gross per 24 hour  Intake 100 ml  Output 1700 ml  Net -1600 ml   Filed Weights   01/30/20 1508  Weight: 83 kg    Examination:  Constitutional: NAD Eyes: no scleral icterus ENMT: Mucous membranes are moist.  Neck: normal, supple Respiratory: clear to auscultation bilaterally, no wheezing, no crackles.  Cardiovascular: Regular rate and rhythm, no murmurs / rubs / gallops.  Abdomen: non distended, no tenderness.  Skin: no rashes Neurologic: Parkinsonism features present with tremors at rest.  No focal deficits Psychiatric: Alert to self, place, time   Data Reviewed: I have independently reviewed following labs and imaging studies   CBC: Recent Labs  Lab 01/26/20 0108 01/30/20 1628 01/31/20 0150  WBC 3.3* 4.3 3.7*  NEUTROABS  --  3.0  --   HGB 11.3* 9.9* 9.7*  HCT 35.0* 34.2* 32.7*  MCV 92.1 98.8 99.1  PLT 137* 178 153   Basic Metabolic Panel: Recent Labs  Lab 01/26/20 0108 01/30/20 1628 01/31/20 0150 02/01/20 0143  NA 138 138 140 137  K 4.4 5.3* 4.7 4.4  CL 99 95* 98 94*  CO2 30 32 33* 35*  GLUCOSE 131* 242* 172* 193*  BUN 11 46* 38* 19  CREATININE 0.83 1.76* 1.32* 0.82  CALCIUM 8.5* 9.2 8.9 9.2   Liver Function Tests: Recent Labs  Lab 01/30/20 1628  AST 30  ALT <5  ALKPHOS 169*  BILITOT 0.9  PROT 6.6  ALBUMIN 3.2*   Coagulation Profile: No results for input(s): INR, PROTIME in the last 168 hours. HbA1C: No results for  input(s): HGBA1C in the last 72 hours. CBG: Recent Labs  Lab 01/25/20 2050 01/26/20 0724 01/26/20 1155 01/26/20 1345 01/26/20 1610  GLUCAP 209* 127* 248* 179* 134*    Recent Results (from the past 240 hour(s))  SARS Coronavirus 2 by RT PCR (hospital order, performed in [facility] hospital lab) Nasopharyngeal Nasopharyngeal Swab     Status: None   Collection Time: 01/22/20  6:29 PM   Specimen: Nasopharyngeal Swab  Result Value Ref Range Status   SARS Coronavirus 2 NEGATIVE NEGATIVE Final    Comment: (NOTE) SARS-CoV-2 target nucleic acids are NOT DETECTED.  The SARS-CoV-2 RNA is generally detectable in upper and lower respiratory  specimens during the acute phase of infection. The lowest concentration of SARS-CoV-2 viral copies this assay can detect is 250 copies / mL. A negative result does not preclude SARS-CoV-2 infection and should not be used as the sole basis for treatment or other patient management decisions.  A negative result may occur with improper specimen collection / handling, submission of specimen other than nasopharyngeal swab, presence of viral mutation(s) within the areas targeted by this assay, and inadequate number of viral copies (<250 copies / mL). A negative result must be combined with clinical observations, patient history, and epidemiological information.  Fact Sheet for Patients:   BoilerBrush.com.cy  Fact Sheet for Healthcare Providers: https://pope.com/  This test is not yet approved or  cleared by the [place] FDA and has been authorized for detection and/or diagnosis of SARS-CoV-2 by FDA under an Emergency Use Authorization (EUA).  This EUA will remain in effect (meaning this test can be used) for the duration of the COVID-19 declaration under Section 564(b)(1) of the Act, 21 U.S.C. section 360bbb-3(b)(1), unless the authorization is terminated or revoked sooner.  Performed at [facility], [address], [place], [place] [address]   SARS Coronavirus 2 by RT PCR (hospital order, performed in [facility] hospital lab) Nasopharyngeal Nasopharyngeal Swab     Status: None   Collection Time: 01/30/20  5:41 PM   Specimen: Nasopharyngeal Swab  Result Value Ref Range Status   SARS Coronavirus 2 NEGATIVE NEGATIVE Final    Comment: (NOTE) SARS-CoV-2 target nucleic acids are NOT DETECTED.  The SARS-CoV-2 RNA is generally detectable in upper and lower respiratory specimens during the acute phase of infection. The lowest concentration of SARS-CoV-2 viral copies this assay can detect is 250 copies / mL. A negative result does not preclude SARS-CoV-2 infection and should not be used as the sole basis for treatment or other patient management decisions.  A negative result may occur with improper specimen collection / handling, submission of specimen other than nasopharyngeal swab, presence of viral mutation(s) within the areas targeted by this assay, and inadequate number of viral copies (<250 copies / mL). A negative result must be combined with clinical observations, patient history, and epidemiological information.  Fact Sheet for Patients:   BoilerBrush.com.cy  Fact Sheet for Healthcare Providers: https://pope.com/  This test is not yet approved or  cleared by the [place] FDA and has been authorized for detection and/or diagnosis of SARS-CoV-2 by FDA under an Emergency Use Authorization (EUA).  This EUA will remain in effect (meaning this test can be used) for the duration of the COVID-19 declaration under Section 564(b)(1) of the Act, 21 U.S.C. section 360bbb-3(b)(1), unless the authorization is terminated or revoked sooner.  Performed at [facility], [address] [place], [place] [address]      Radiology Studies: ECHOCARDIOGRAM LIMITED  Result Date: 01/31/2020    ECHOCARDIOGRAM LIMITED REPORT    Patient Name:   Sara Raymond Date of Exam: 01/31/2020 Medical Rec #:  [id number]       Height:       63.0 in Accession #:    [id number]      Weight:       183.0 lb Date of Birth:  [birth year]       BSA:          1.862 m Patient Age:    68 years        BP:           160/83  mmHg Patient Gender: F               HR:           91 bpm. Exam Location:  Inpatient Procedure: Limited Echo, Limited Color Doppler and Cardiac Doppler Indications:    Dyspnea R06.00  History:        Patient has prior history of Echocardiogram examinations, most                 recent 01/23/2020. Risk Factors:Hypertension and Diabetes.  Sonographer:    Thurman Coyer RDCS (AE) Referring Phys: [id number] York Ram ARRIEN IMPRESSIONS  1. Left ventricular ejection fraction, by estimation, is 60 to 65%. The left ventricle has normal function. The left ventricle has no regional wall motion abnormalities. There is mild asymmetric left ventricular hypertrophy of the basal-septal segment.  2. Right ventricular systolic function is normal. The right ventricular size is normal. There is moderately elevated pulmonary artery systolic pressure. The estimated right ventricular systolic pressure is 52.7 mmHg.  3. The mitral valve is normal in structure. Trivial mitral valve regurgitation.  4. The aortic valve was not well visualized. Aortic valve regurgitation is trivial. Mild aortic valve sclerosis is present, with no evidence of aortic valve stenosis.  5. The inferior vena cava is dilated in size with <50% respiratory variability, suggesting right atrial pressure of 15 mmHg. FINDINGS  Left Ventricle: Left ventricular ejection fraction, by estimation, is 60 to 65%. The left ventricle has normal function. The left ventricle has no regional wall motion abnormalities. The left ventricular internal cavity size was normal in size. There is  mild asymmetric left ventricular hypertrophy of the basal-septal segment. Right Ventricle: The right ventricular size is  normal. Right ventricular systolic function is normal. There is moderately elevated pulmonary artery systolic pressure. The tricuspid regurgitant velocity is 3.07 m/s, and with an assumed right atrial pressure of 15 mmHg, the estimated right ventricular systolic pressure is 52.7 mmHg. Pericardium: There is no evidence of pericardial effusion. Mitral Valve: The mitral valve is normal in structure. Trivial mitral valve regurgitation. Tricuspid Valve: The tricuspid valve is normal in structure. Tricuspid valve regurgitation is trivial. Aortic Valve: The aortic valve was not well visualized. Aortic valve regurgitation is trivial. Mild aortic valve sclerosis is present, with no evidence of aortic valve stenosis. Aorta: The aortic root and ascending aorta are structurally normal, with no evidence of dilitation. Venous: The inferior vena cava is dilated in size with less than 50% respiratory variability, suggesting right atrial pressure of 15 mmHg. LEFT VENTRICLE PLAX 2D LVIDd:         4.20 cm LVIDs:         2.70 cm LV PW:         0.90 cm LV IVS:        0.90 cm LVOT diam:     2.00 cm LVOT Area:     3.14 cm  LEFT ATRIUM         Index LA diam:    3.10 cm 1.66 cm/m   AORTA Ao Root diam: 3.00 cm Ao Asc diam:  3.20 cm TRICUSPID VALVE TR Peak grad:   37.7 mmHg TR Vmax:        307.00 cm/s  SHUNTS Systemic Diam: 2.00 cm Epifanio Lesches MD Electronically signed by Epifanio Lesches MD Signature Date/Time: 01/31/2020/5:03:39 PM    Final      Pamella Pert, MD, PhD [facility]  Between 7 am - 7 pm I am available, please contact  me via Amion or Securechat  Between 7 pm - 7 am I am not available, please contact night coverage MD/APP via Amion

## 2020-02-02 LAB — URINE CULTURE: Culture: >=100000 — AB

## 2020-02-02 MED ORDER — PREDNISONE 20 MG PO TABS
40.0000 mg | ORAL_TABLET | Freq: Every day | ORAL | Status: DC
  Administered 2020-02-03 – 2020-02-04 (×2): 40 mg via ORAL
  Filled 2020-02-02 (×2): qty 2

## 2020-02-02 NOTE — TOC Initial Note (Signed)
Transition of Care [facility]) - Initial/Assessment Note    Patient Details  Name: Sara Raymond MRN: [id number] Date of Birth: 1950-09-03  Transition of Care [facility]) CM/SW Contact:    Sara Lenis, LCSW Phone Number:  02/02/2020, 5:11 PM  Clinical Narrative:      CSW attempted to reach patient's daughter again today, left a voicemail. Patient's spouse was at bedside, confirmed plan to return to [facility] for SNF. CSW sent in request to [facility] Medicare for approval for SNF, and left a message to confirm bed availability at [facility]. CSW to follow.             Expected Discharge Plan: Skilled Nursing Facility Barriers to Discharge: Insurance Authorization, Continued Medical Work up   Patient Goals and CMS Choice Patient states their goals for this hospitalization and ongoing recovery are:: patient unable to participate in goal setting due to disorientation CMS Medicare.gov Compare Post Acute Care list provided to:: Patient Represenative (must comment) Choice offered to / list presented to : Spouse  Expected Discharge Plan and Services Expected Discharge Plan: Skilled Nursing Facility     Post Acute Care Choice: Skilled Nursing Facility Living arrangements for the past 2 months: Single Family Home                                      Prior Living Arrangements/Services Living arrangements for the past 2 months: Single Family Home Lives with:: Spouse Patient language and need for interpreter reviewed:: No Do you feel safe going back to the place where you live?: Yes      Need for Family Participation in Patient Care: Yes (Comment) Care giver support system in place?: No (comment)   Criminal Activity/Legal Involvement Pertinent to Current Situation/Hospitalization: No - Comment as needed  Activities of Daily Living      Permission Sought/Granted Permission sought to share information with : Facility Medical sales representative, Family Supports Permission granted to  share information with : Yes, Verbal Permission Granted  Share Information with NAME: Sara Raymond  Permission granted to share info w AGENCY: SNF  Permission granted to share info w Relationship: Spouse, Daughter     Emotional Assessment   Attitude/Demeanor/Rapport: Unable to Assess Affect (typically observed): Unable to Assess Orientation: : Oriented to Self      Admission diagnosis:  Shortness of breath [R06.02] Altered mental state [R41.82] Elevated troponin level [R77.8] Dysarthria [R47.1] AKI (acute kidney injury) (HCC) [N17.9] Altered mental status, unspecified altered mental status type [R41.82] Encephalopathy [G93.40] Patient Active Problem List   Diagnosis Date Noted   Encephalopathy 01/31/2020   Altered mental state 01/30/2020   AKI (acute kidney injury) (HCC) 01/30/2020   Hyperkalemia 01/30/2020   Type 2 diabetes mellitus (HCC) 01/30/2020   CKD stage 2 due to type 2 diabetes mellitus (HCC) 01/30/2020   Generalized weakness 01/22/2020   Acute CVA (cerebrovascular accident) (HCC) 08/23/2018   TIA (transient ischemic attack) 08/22/2018   Seizures (HCC) 08/22/2018   Hypertension 08/22/2018   Type II diabetes mellitus with stage 3 chronic kidney disease (HCC) 08/22/2018   CKD (chronic kidney disease), stage III 08/22/2018   Parkinson disease (HCC) 08/22/2018   PCP:  Sara Chessman, MD Pharmacy:   [facility] [phone] - [place], [place] - [address] AT [facility] [address] [place] [address] Phone: [phone] Fax: [phone]  [facility] - [place],  [place] - [address] [place] [address] Phone: [phone] Fax: [phone]     Social Determinants of Health (SDOH) Interventions    Readmission Risk Interventions No flowsheet data found.

## 2020-02-02 NOTE — Progress Notes (Signed)
CSW attempted to reach daughter for assessment and to confirm return to [facility]. Left a voicemail, awaiting call back.  Blenda Nicely, [place] Clinical Social Worker [phone]

## 2020-02-02 NOTE — Progress Notes (Signed)
PROGRESS NOTE  Sara Raymond JSE:[id number] DOB: 03/10/1951 DOA: 01/30/2020 PCP: Angelica Chessman, MD   LOS: 2 days   Brief Narrative / Interim history: 69 year old female with history of Parkinson's disease, seizure disorder, prior CVA, chronic kidney disease who presents to the hospital from her SNF due to altered mental status.  Of note, she was admitted on 8/8 and discharged on 8/12 for ataxia, dysarthria, neurology evaluated patient and an MRI was negative for acute findings, it showed only chronic infarcts.  Neurology felt like her presentation may have been due to medications, and her primidone has been discontinued.  Shortly after admission she also had agitation as well as worsening hypoxemia and felt to be wheezing in the setting of her underlying asthma.  On admission she was found to have acute kidney injury with a creatinine of 1.7, UA with many bacteria and an elevated high-sensitivity troponin around 200.  An MRI on admission was negative for acute infarct.  Subjective / 24h Interval events: She feels well this morning, eating breakfast.  No chest pain, no abdominal pain, no nausea or vomiting.  Unable to reach daughter over the phone yesterday and today  Assessment & Plan: Principal Problem Acute metabolic encephalopathy-unclear precipitant, MRI on admission was negative for acute findings.  She does have some evidence of a UTI, she was empirically started on antibiotics with improvement in her mental status.  I suspect she is close to baseline however family has not been in to visit an MRI unable to reach the patient's daughter.  Active Problems Acute asthma exacerbation with acute on chronic hypoxic and hypercapnic respiratory failure-8/17 was noted to have wheezing, she was started on nebulizers along with Solu-Medrol.  Continue for now, her wheezing has resolved..  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  Continue aspirin.  Possible UTI-urine culture sent species present, suggesting recollection.  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 will need outpatient follow up.   Elevated troponin-in the setting of decreased GFR, in the low 200s, flat, no chest pain.  EKG without ischemic changes, 2D echo without WMA.  On aspirin.   Scheduled Meds: . aspirin EC  81 mg Oral Daily  . atenolol  25 mg Oral Daily  . carbidopa-levodopa  1 tablet Oral TID  . cephALEXin  500 mg Oral Q12H  . enoxaparin (LOVENOX) injection  40 mg Subcutaneous Q24H  . feeding supplement (ENSURE ENLIVE)  237 mL Oral BID BM  . folic acid  1 mg Oral Daily  . lamoTRIgine  200 mg Oral BID  . methylPREDNISolone (SOLU-MEDROL) injection  40 mg Intravenous Q24H  . mometasone-formoterol  2 puff Inhalation BID  . multivitamin with minerals  1 tablet Oral Daily  . polyethylene glycol  17 g Oral Daily  . thiamine  100 mg Oral Daily   Continuous Infusions: PRN Meds:.acetaminophen **OR** acetaminophen, haloperidol lactate, ipratropium-albuterol, ondansetron **OR** ondansetron (ZOFRAN) IV  Diet Orders (From admission, onward)    Start     Ordered   01/31/20 1138  Diet heart healthy/carb modified Room service appropriate? Yes; Fluid consistency: Thin  Diet effective now       Question Answer Comment  Diet-HS Snack? Nothing   Room service appropriate?  Yes   Fluid consistency: Thin      01/31/20 1143          DVT prophylaxis: enoxaparin (LOVENOX) injection 40 mg Start: 01/30/20 2200 SCDs Start: 01/30/20 2111     Code Status: Full Code  Family Communication: will call daughter  Status is: Inpatient  Remains inpatient  appropriate because:Inpatient level of care appropriate due to severity of illness   Dispo: The patient is from: SNF              Anticipated d/c is to: SNF              Anticipated d/c date is: 2 days              Patient currently is not medically stable to d/c.  Consultants:  None   Procedures:  2D echo  Microbiology  Urine cultures - pending  Antimicrobials: Keflex 8/18 >>    Objective: Vitals:   02/01/20 2042 02/01/20 2334 02/02/20 0359 02/02/20 1013  BP:  117/90 (!) 162/97 (!) 161/75  Pulse:  97 (!) 101 72  Resp:  16 16 18   Temp:  98.6 F (37 C) 98.8 F (37.1 C) 97.8 F (36.6 C)  TempSrc:  Oral Oral Oral  SpO2: 98% 100% 100%   Weight:      Height:        Intake/Output Summary (Last 24 hours) at 02/02/2020 1104 Last data filed at 02/02/2020 0500 Gross per 24 hour  Intake 70 ml  Output 1150 ml  Net -1080 ml   Filed Weights   01/30/20 1508  Weight: 83 kg    Examination:  Constitutional: NAD Eyes: no icterus  ENMT: mmm Neck: normal, supple Respiratory: cta biL, no wheezing, no crackles Cardiovascular: rrr, no mrg, no edema Abdomen: soft, nt, nd, bs+ Skin: no rashes Neurologic: Parkinsonism features present with tremors at rest.  No focal deficits.  Alert and oriented x3   Data Reviewed: I have independently reviewed following labs and imaging studies   CBC: Recent Labs  Lab 01/30/20 1628 01/31/20 0150  WBC 4.3 3.7*  NEUTROABS 3.0  --   HGB 9.9* 9.7*  HCT 34.2* 32.7*  MCV 98.8 99.1  PLT 178 153   Basic Metabolic Panel: Recent Labs  Lab 01/30/20 1628 01/31/20 0150 02/01/20 0143  NA 138 140 137  K 5.3* 4.7 4.4  CL 95* 98 94*  CO2 32 33* 35*  GLUCOSE 242* 172* 193*  BUN 46* 38* 19  CREATININE 1.76* 1.32* 0.82  CALCIUM 9.2 8.9 9.2   Liver Function Tests: Recent Labs  Lab 01/30/20 1628  AST 30  ALT <5  ALKPHOS 169*  BILITOT 0.9  PROT 6.6  ALBUMIN 3.2*   Coagulation Profile: No results for input(s): INR, PROTIME in the  last 168 hours. HbA1C: No results for input(s): HGBA1C in the last 72 hours. CBG: Recent Labs  Lab 01/26/20 1155 01/26/20 1345 01/26/20 1610  GLUCAP 248* 179* 134*    Recent Results (from the past 240 hour(s))  SARS Coronavirus 2 by RT PCR (hospital order, performed in [facility] hospital lab) Nasopharyngeal Nasopharyngeal Swab     Status: None   Collection Time: 01/30/20  5:41 PM   Specimen: Nasopharyngeal Swab  Result Value Ref Range Status   SARS Coronavirus 2 NEGATIVE NEGATIVE Final    Comment: (NOTE) SARS-CoV-2 target nucleic acids are NOT DETECTED.  The SARS-CoV-2 RNA is generally detectable in upper and lower respiratory specimens during the acute phase of infection.  The lowest concentration of SARS-CoV-2 viral copies this assay can detect is 250 copies / mL. A negative result does not preclude SARS-CoV-2 infection and should not be used as the sole basis for treatment or other patient management decisions.  A negative result may occur with improper specimen collection / handling, submission of specimen other than nasopharyngeal swab, presence of viral mutation(s) within the areas targeted by this assay, and inadequate number of viral copies (<250 copies / mL). A negative result must be combined with clinical observations, patient history, and epidemiological information.  Fact Sheet for Patients:   BoilerBrush.com.cy  Fact Sheet for Healthcare Providers: https://pope.com/  This test is not yet approved or  cleared by the [place] FDA and has been authorized for detection and/or diagnosis of SARS-CoV-2 by FDA under an Emergency Use Authorization (EUA).  This EUA will remain in effect (meaning this test can be used) for the duration of the COVID-19 declaration under Section 564(b)(1) of the Act, 21 U.S.C. section 360bbb-3(b)(1), unless the authorization is terminated or revoked sooner.  Performed at [facility], [address] [place], [place] [address]   Culture, Urine     Status: Abnormal   Collection Time: 02/01/20  7:22 AM   Specimen: Urine, Random  Result Value Ref Range Status   Specimen Description URINE, RANDOM  Final   Special Requests   Final    NONE Performed at [facility], [address] [place], [place] [address]    Culture (A)  Final    >=100,000 COLONIES/mL MULTIPLE SPECIES PRESENT, SUGGEST RECOLLECTION   Report Status 02/02/2020 FINAL  Final     Radiology Studies: No results found.   Pamella Pert, MD, PhD [facility]  Between 7 am - 7 pm I am available, please contact me via Amion or Securechat  Between 7 pm - 7 am I am not available, please contact night coverage MD/APP via Amion

## 2020-02-02 NOTE — Progress Notes (Signed)
Physical Therapy Treatment Patient Details Name: Sara Raymond MRN: [id number] DOB: 08-20-1950 Today's Date: 02/02/2020    History of Present Illness Pt isa 69 y/o female with PMH of seizures, parkinson's, HTN, DM 2, CKD stage 2, CVA and asthma.  Presents from SNF with acute AMS 4 days after being discharged from the hospital. CT, MRI negative. +acute metabolic encephalopathy    PT Comments    Pt is progressing well towards goals. She required less assist for bed mobilities and progressed to recliner chair with use of stedy. Patient would benefit from continued skilled PT to maximize functional independence and safety with mobitliy. Will continue to follow acutely.    Follow Up Recommendations  SNF;Supervision/Assistance - 24 hour     Equipment Recommendations  Other (comment) (TBD at next venue)    Recommendations for Other Services       Precautions / Restrictions Precautions Precautions: Fall    Mobility  Bed Mobility Overal bed mobility: Needs Assistance Bed Mobility: Supine to Sit     Supine to sit: Min assist;+2 for physical assistance;+2 for safety/equipment;HOB elevated     General bed mobility comments: Pt able to progress LEs and elevate trunk. Assist given at hips to scoot forward. Cues for sequencing.   Transfers Overall transfer level: Needs assistance   Transfers: Sit to/from Stand Sit to Stand: Mod assist;+2 physical assistance;+2 safety/equipment         General transfer comment: light mod A +2 to rise from EOB with stedy. Cues for sequencing and technique.   Ambulation/Gait                 Stairs             Wheelchair Mobility    Modified Rankin (Stroke Patients Only)       Balance Overall balance assessment: Needs assistance Sitting-balance support: No upper extremity supported;Feet supported Sitting balance-Leahy Scale: Fair Sitting balance - Comments: able to statically sit with min guard Postural control: Right  lateral lean Standing balance support: Bilateral upper extremity supported;During functional activity Standing balance-Leahy Scale: Poor Standing balance comment: able to stand with stedy + mod A                            Cognition Arousal/Alertness: Awake/alert Behavior During Therapy: WFL for tasks assessed/performed Overall Cognitive Status: Impaired/Different from baseline Area of Impairment: Attention;Memory;Following commands;Safety/judgement;Awareness;Problem solving;Orientation                 Orientation Level: Disoriented to;Situation (month and year, but not day) Current Attention Level: Focused Memory: Decreased recall of precautions;Decreased short-term memory Following Commands: Follows one step commands inconsistently;Follows one step commands with increased time Safety/Judgement: Decreased awareness of safety;Decreased awareness of deficits Awareness: Intellectual Problem Solving: Slow processing;Requires verbal cues;Decreased initiation;Difficulty sequencing;Requires tactile cues General Comments: Pt quiet but answers questions appropriatly when asked. Slow processing, and requires multimodal cues.       Exercises      General Comments        Pertinent Vitals/Pain Pain Assessment: Faces Faces Pain Scale: No hurt    Home Living                      Prior Function            PT Goals (current goals can now be found in the care plan section) Acute Rehab PT Goals Patient Stated Goal: none stated PT Goal Formulation: With patient Time  For Goal Achievement: 02/14/20 Potential to Achieve Goals: Good Progress towards PT goals: Progressing toward goals    Frequency    Min 2X/week      PT Plan Current plan remains appropriate    Co-evaluation              AM-PAC PT "6 Clicks" Mobility   Outcome Measure  Help needed turning from your back to your side while in a flat bed without using bedrails?: A Little Help  needed moving from lying on your back to sitting on the side of a flat bed without using bedrails?: A Little Help needed moving to and from a bed to a chair (including a wheelchair)?: A Lot Help needed standing up from a chair using your arms (e.g., wheelchair or bedside chair)?: A Lot Help needed to walk in hospital room?: Total Help needed climbing 3-5 steps with a railing? : Total 6 Click Score: 12    End of Session Equipment Utilized During Treatment: Gait belt Activity Tolerance: Patient tolerated treatment well Patient left: with call bell/phone within reach;in chair;with chair alarm set Nurse Communication: Mobility status;Need for lift equipment PT Visit Diagnosis: Unsteadiness on feet (R26.81);Muscle weakness (generalized) (M62.81);History of falling (Z91.81)     Time: 0017-4944 PT Time Calculation (min) (ACUTE ONLY): 20 min  Charges:  $Therapeutic Activity: 8-22 mins                    Kallie Locks, [place] Pager [phone] Acute Rehab   Sheral Apley 02/02/2020, 3:36 PM

## 2020-02-03 MED ORDER — GABAPENTIN 800 MG PO TABS: 400.0000 mg | ORAL_TABLET | Freq: Two times a day (BID) | ORAL | Status: AC

## 2020-02-03 MED ORDER — PREDNISONE 20 MG PO TABS: 40.0000 mg | ORAL_TABLET | Freq: Every day | ORAL | 0 refills | Status: AC

## 2020-02-03 NOTE — TOC Progression Note (Signed)
Transition of Care [facility]) - Progression Note    Patient Details  Name: Sara Raymond MRN: [id number] Date of Birth: Jan 31, 1951  [facility] of [facility]) CM/SW Contact  Baldemar [place], [place] Phone Number: 02/03/2020, 4:32 PM  Clinical Narrative:   CSW confirmed bed availability at [facility], and spoke with patient's daughter, Lissa Hoard, to confirm plan to return to [facility] at [place]. CSW contacted [facility] to check status of authorization request; it is still pending, has not been assigned to a case manager yet. [facility] can admit patient if Berkley Harvey is received over the weekend. CSW to follow.    Expected Discharge Plan: Skilled Nursing Facility Barriers to Discharge: English as a second language teacher, Continued Medical Work up  Expected Discharge Plan and Services Expected Discharge Plan: Skilled Nursing Facility     Post Acute Care Choice: Skilled Nursing Facility Living arrangements for the past 2 months: Single Family Home                                       Social Determinants of Health (SDOH) Interventions    Readmission Risk Interventions No flowsheet data found.

## 2020-02-03 NOTE — Progress Notes (Signed)
PROGRESS NOTE  Sara Raymond XLK:[id number] DOB: 11-12-1950 DOA: 01/30/2020 PCP: Angelica Chessman, MD   LOS: 3 days   Brief Narrative / Interim history: 69 year old female with history of Parkinson's disease, seizure disorder, prior CVA, chronic kidney disease who presents to the hospital from her SNF due to altered mental status.  Of note, she was admitted on 8/8 and discharged on 8/12 for ataxia, dysarthria, neurology evaluated patient and an MRI was negative for acute findings, it showed only chronic infarcts.  Neurology felt like her presentation may have been due to medications, and her primidone has been discontinued.  Shortly after admission she also had agitation as well as worsening hypoxemia and felt to be wheezing in the setting of her underlying asthma.  On admission she was found to have acute kidney injury with a creatinine of 1.7, UA with many bacteria and an elevated high-sensitivity troponin around 200.  An MRI on admission was negative for acute infarct.  Subjective / 24h Interval events: Feeling well, no complaints. Eating breakfast  Assessment & Plan: Principal Problem Acute metabolic encephalopathy-possibly related to UTI, MRI on admission was negative for acute findings.  She was empirically started on antibiotics with improvement in her mental status, and per family she has returned to baseline, AxOx4. She recently had a similar hospitalization, neurology was consulted at that time and her Primidone was stopped and there were discussions about decreasing her gabapentin. After neurology discussed with the family it was decided to continue same Neurontin dose. It is possible that her mental status changes may be related to medications, I have discussed with patient's daughter and she is in agreement to decrease the Neurontin dose on discharge.  Active Problems Acute asthma exacerbation with acute on chronic hypoxic and hypercapnic respiratory failure-8/17 was noted to have  wheezing, she was started on nebulizers along with Solu-Medrol. 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  Continue aspirin. Possible UTI-urine culture sent species present, suggesting recollection.  Empiric Keflex for 3 days Obesity class I-BMI 32.4. will need outpatient follow up.  Elevated troponin-in the setting of decreased GFR, flat, not in a pattern consistent with ACS, no chest pain.  EKG without ischemic changes, 2D echo without WMA.  On aspirin.   Scheduled Meds: . aspirin EC  81 mg Oral Daily  . atenolol  25 mg Oral Daily  . carbidopa-levodopa  1 tablet Oral TID  . cephALEXin  500 mg Oral Q12H  . enoxaparin (LOVENOX) injection  40 mg Subcutaneous Q24H  . feeding supplement (ENSURE ENLIVE)  237 mL Oral BID BM  . folic acid  1 mg Oral Daily  . lamoTRIgine  200 mg Oral BID  . mometasone-formoterol  2 puff Inhalation BID  . multivitamin with minerals  1 tablet Oral Daily  . polyethylene glycol  17 g Oral Daily  . predniSONE  40 mg Oral Q breakfast  . thiamine  100 mg Oral Daily   Continuous Infusions: PRN Meds:.acetaminophen **OR** acetaminophen, haloperidol lactate, ipratropium-albuterol, ondansetron **OR** ondansetron (ZOFRAN) IV  Diet Orders (From admission, onward)    Start     Ordered   01/31/20 1138  Diet heart healthy/carb modified Room service appropriate? Yes; Fluid consistency: Thin  Diet effective now  Question Answer Comment  Diet-HS Snack? Nothing   Room service appropriate? Yes   Fluid consistency: Thin      01/31/20 1143          DVT prophylaxis: enoxaparin (LOVENOX) injection 40 mg Start: 01/30/20  2200 SCDs Start: 01/30/20 2111     Code Status: Full Code  Family Communication: d/w daughter   Status is: Inpatient  Remains inpatient appropriate because:Inpatient level of care appropriate due to severity of illness   Dispo: The patient is from: SNF              Anticipated d/c is to: SNF              Anticipated d/c date is: 2 days              Patient currently is medically stable to d/c.  Consultants:  None   Procedures:  2D echo  Microbiology  Urine cultures - multiple species  Antimicrobials: Keflex 8/18 >> 8/20   Objective: Vitals:   02/02/20 2339 02/03/20 0320 02/03/20 0821 02/03/20 1149  BP: (!) 155/83 (!) 151/80 (!) 143/66 124/65  Pulse: 77 79 74 77  Resp: 18 18 20 20   Temp: 98.5 F (36.9 C) 98.3 F (36.8 C) 98.2 F (36.8 C) 98.3 F (36.8 C)  TempSrc: Oral Oral Oral Oral  SpO2: 100% 99% 100% 100%  Weight:      Height:        Intake/Output Summary (Last 24 hours) at 02/03/2020 1435 Last data filed at 02/03/2020 0945 Gross per 24 hour  Intake 290 ml  Output 1100 ml  Net -810 ml   Filed Weights   01/30/20 1508  Weight: 83 kg    Examination:  Constitutional: NAD Eyes: no icterus  ENMT: mmm Neck: normal, supple Respiratory: cta biL, no wheezing, no crackles Cardiovascular: rrr, no mrg, no edema  Abdomen: soft, nt, nd, BS+ Skin: no rashes Neurologic: Parkinsonism features present with tremors at rest. No focal deficits   Data Reviewed: I have independently reviewed following labs and imaging studies   CBC: Recent Labs  Lab 01/30/20 1628 01/31/20 0150  WBC 4.3 3.7*  NEUTROABS 3.0  --   HGB 9.9* 9.7*  HCT 34.2* 32.7*  MCV 98.8 99.1  PLT 178 153   Basic Metabolic Panel: Recent Labs  Lab 01/30/20 1628 01/31/20 0150 02/01/20 0143  NA 138 140 137  K 5.3* 4.7 4.4  CL 95* 98 94*  CO2 32 33* 35*  GLUCOSE 242* 172* 193*  BUN 46* 38* 19  CREATININE 1.76* 1.32* 0.82  CALCIUM 9.2 8.9 9.2   Liver Function Tests: Recent Labs    Lab 01/30/20 1628  AST 30  ALT <5  ALKPHOS 169*  BILITOT 0.9  PROT 6.6  ALBUMIN 3.2*   Coagulation Profile: No results for input(s): INR, PROTIME in the last 168 hours. HbA1C: No results for input(s): HGBA1C in the last 72 hours. CBG: No results for input(s): GLUCAP in the last 168 hours.  Recent Results (from the past 240 hour(s))  SARS Coronavirus 2 by RT PCR (hospital order, performed in [facility] hospital lab) Nasopharyngeal Nasopharyngeal Swab     Status: None   Collection Time: 01/30/20  5:41 PM   Specimen: Nasopharyngeal Swab  Result Value Ref Range Status   SARS Coronavirus 2 NEGATIVE NEGATIVE Final    Comment: (NOTE) SARS-CoV-2 target nucleic acids are NOT DETECTED.  The SARS-CoV-2 RNA is generally detectable in upper and lower respiratory specimens during the  acute phase of infection. The lowest concentration of SARS-CoV-2 viral copies this assay can detect is 250 copies / mL. A negative result does not preclude SARS-CoV-2 infection and should not be used as the sole basis for treatment or other patient management decisions.  A negative result may occur with improper specimen collection / handling, submission of specimen other than nasopharyngeal swab, presence of viral mutation(s) within the areas targeted by this assay, and inadequate number of viral copies (<250 copies / mL). A negative result must be combined with clinical observations, patient history, and epidemiological information.  Fact Sheet for Patients:   BoilerBrush.com.cy  Fact Sheet for Healthcare Providers: https://pope.com/  This test is not yet approved or  cleared by the [place] FDA and has been authorized for detection and/or diagnosis of SARS-CoV-2 by FDA under an Emergency Use Authorization (EUA).  This EUA will remain in effect (meaning this test can be used) for the duration of the COVID-19 declaration under Section 564(b)(1) of  the Act, 21 U.S.C. section 360bbb-3(b)(1), unless the authorization is terminated or revoked sooner.  Performed at [facility], [address] [place], [place] [address]   Culture, Urine     Status: Abnormal   Collection Time: 02/01/20  7:22 AM   Specimen: Urine, Random  Result Value Ref Range Status   Specimen Description URINE, RANDOM  Final   Special Requests   Final    NONE Performed at [facility], [address] [place], [place] [address]    Culture (A)  Final    >=100,000 COLONIES/mL MULTIPLE SPECIES PRESENT, SUGGEST RECOLLECTION   Report Status 02/02/2020 FINAL  Final     Radiology Studies: No results found.   Pamella Pert, MD, PhD [facility]  Between 7 am - 7 pm I am available, please contact me via Amion or Securechat  Between 7 pm - 7 am I am not available, please contact night coverage MD/APP via Amion

## 2020-02-04 DIAGNOSIS — G9340 Encephalopathy, unspecified: Secondary | ICD-10-CM

## 2020-02-04 DIAGNOSIS — R778 Other specified abnormalities of plasma proteins: Secondary | ICD-10-CM

## 2020-02-04 LAB — BASIC METABOLIC PANEL
Anion gap: 9 (ref 5–15)
BUN: 23 mg/dL (ref 8–23)
CO2: 40 mmol/L — ABNORMAL HIGH (ref 22–32)
Calcium: 9.7 mg/dL (ref 8.9–10.3)
Chloride: 89 mmol/L — ABNORMAL LOW (ref 98–111)
Creatinine, Ser: 1.03 mg/dL — ABNORMAL HIGH (ref 0.44–1.00)
GFR calc Af Amer: >60 mL/min (ref >60)
GFR calc non Af Amer: 55 mL/min — ABNORMAL LOW (ref >60)
Glucose, Bld: 214 mg/dL — ABNORMAL HIGH (ref 70–99)
Potassium: 4.7 mmol/L (ref 3.5–5.1)
Sodium: 138 mmol/L (ref 135–145)

## 2020-02-04 NOTE — TOC Transition Note (Signed)
Transition of Care [facility]) - CM/SW Discharge Note   Patient Details  Name: Sara Raymond MRN: [id number] Date of Birth: 1950-09-11  Transition of Care [facility]) CM/SW Contact:  Baldemar Lenis, LCSW Phone Number: 02/04/2020, 11:16 AM   Clinical Narrative:   Nurse to call report to [phone], Room 307.    Final next level of care: Skilled Nursing Facility Barriers to Discharge: Barriers Resolved   Patient Goals and CMS Choice Patient states their goals for this hospitalization and ongoing recovery are:: patient unable to participate in goal setting due to disorientation CMS Medicare.gov Compare Post Acute Care list provided to:: Patient Represenative (must comment) Choice offered to / list presented to : Spouse  Discharge Placement              Patient chooses bed at:  [facility]) Patient to be transferred to facility by: [facility] Name of family member notified: Self, Left voicemail for daughter Patient and family notified of of transfer: 02/04/20  Discharge Plan and Services     Post Acute Care Choice: Skilled Nursing Facility                               Social Determinants of Health (SDOH) Interventions     Readmission Risk Interventions No flowsheet data found.

## 2020-02-04 NOTE — Progress Notes (Signed)
Discharge Instructions placed in packet for receiving facility. IV removed, Telemetry discontinued. Belongings returned. Nurse called report to [facility], All questions answered. [facility] arrived for transport to facility.  Melony Overly, RN

## 2020-02-04 NOTE — Discharge Summary (Signed)
Physician Discharge Summary  Sara Raymond LOV:[id number] DOB: 12/17/1950 DOA: 01/30/2020  PCP: Angelica Chessman, MD  Admit date: 01/30/2020 Discharge date: 02/04/2020  Admitted From: SNF Disposition:  SNF  Recommendations for Outpatient Follow-up:  1. Follow up with PCP in 1-2 weeks  Home Health: none Equipment/Devices: none  Discharge Condition: stable CODE STATUS: Full code Diet recommendation: regular  HPI: Per admitting MD, Sara Raymond is a 69 y.o. female with medical history significant of seizures, Parkinson's disease, hypertension, type 2 diabetes mellitus, ckd stage 2 and asthma.  Patient was recently hospitalized for worsening ataxia and dysarthria, attributed to primidone.  Patient was discharged to skilled nursing facility for physical rehabilitation, on January 26, 2020.  While at the nursing facility she was not seen by her family due to Covid 19 restrictions.  Her daughter reported that she was too weak to do a outside visit.  Over the following 3 days despite persistent attempts of her family to contact nursing staff or the patient herself it was not possible to get further information. Today her daughter was called notifying her that her mother was not at her baseline and she was being transferred back to the hospital. All information has been obtained from her daughter at the bedside and from ER staff.  Patient not able to give any detailed history due to cognitive impairment  Hospital Course / Discharge diagnoses: Principal Problem Acute metabolic encephalopathy-possibly related to UTI, MRI on admission was negative for acute findings.  She was empirically started on antibiotics with improvement in her mental status, and per family she has returned to baseline, AxOx4. She recently had a similar hospitalization, neurology was consulted at that time and her Primidone was stopped and there were discussions about decreasing her gabapentin. After neurology discussed with  the family it was decided to continue same Neurontin dose. It is possible that her mental status changes may be related to medications, I have discussed with patient's daughter and she is in agreement to decrease the Neurontin dose on discharge.  Active Problems Acute asthma exacerbation with acute on chronic hypoxic and hypercapnic respiratory failure-8/17 was noted to have wheezing, she was started on nebulizers along with Solu-Medrol. 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  Continue aspirin. Possible UTI-urine culture sent species present, suggesting recollection.  Empiric Keflex for 3 days Obesity class I-BMI 32.4.will need outpatient follow up. Elevated troponin-in the setting of decreased GFR, flat, not in a pattern consistent with ACS, no chest pain.  EKG without ischemic changes, 2D echo without WMA.  On aspirin.  Discharge Instructions   Allergies as of 02/04/2020   No Known Allergies     Medication List    TAKE these medications   aspirin EC 81 MG tablet Take 81 mg by mouth daily. Swallow whole.   atenolol 25 MG tablet Commonly known as: TENORMIN Take 25 mg by mouth daily.   Fluticasone-Salmeterol 250-50 MCG/DOSE Aepb Commonly known as: ADVAIR Inhale 1 puff into the lungs 2 (two) times daily as needed (shortness of breath/wheezing).   gabapentin 800 MG tablet Commonly known as: NEURONTIN Take 0.5 tablets (400 mg total) by mouth 2 (two) times daily. What changed:   how much to take  when to take this  lamoTRIgine 200 MG tablet Commonly known as: LAMICTAL Take 200 mg by mouth 2 (two) times daily.   naproxen  sodium 220 MG tablet Commonly known as: ALEVE Take 220-440 mg by mouth 2 (two) times daily as needed (for pain).   OXYGEN Inhale 3 L into the lungs continuous.   predniSONE 20 MG tablet Commonly known as: DELTASONE Take 2 tablets (40 mg total) by mouth daily with breakfast for 3 days.   ProAir HFA 108 (90 Base) MCG/ACT inhaler Generic drug: albuterol Inhale 1-2 puffs into the lungs every 6 (six) hours as needed for wheezing or shortness of breath.   Rytary 61.25-245 MG Cpcr Generic drug: Carbidopa-Levodopa ER Take 1 capsule by mouth 3 (three) times daily.   Rytary 48.75-195 MG Cpcr Generic drug: Carbidopa-Levodopa ER Take 1 capsule by mouth 3 (three) times daily.        Consultations:  None   Procedures/Studies:  DG Chest 1 View  Result Date: 01/30/2020 CLINICAL DATA:  69 year old female with shortness of breath. EXAM: CHEST  1 VIEW COMPARISON:  Chest radiograph dated 01/22/2020. FINDINGS: Minimal bibasilar densities, likely atelectasis. No focal consolidation, pleural effusion or pneumothorax. Top-normal cardiac silhouette. Bilateral hilar prominence similar to prior radiograph, likely related to underlying pulmonary hypertension. Atherosclerotic calcification of the aorta. No acute osseous pathology. IMPRESSION: No focal consolidation. Electronically Signed   By: Elgie Collard M.D.   On: 01/30/2020 23:12   CT Head Wo Contrast  Result Date: 01/30/2020 CLINICAL DATA:  Altered level of consciousness EXAM: CT HEAD WITHOUT CONTRAST TECHNIQUE: Contiguous axial images were obtained from the base of the skull through the vertex without intravenous contrast. COMPARISON:  01/22/2020 FINDINGS: Brain: Stable hypodensities within the periventricular white matter and bilateral basal ganglia consistent with chronic small vessel ischemic changes. No acute infarct or hemorrhage. Lateral ventricles and remaining midline structures are stable. No acute extra-axial fluid collections. No mass  effect. Vascular: No hyperdense vessel or unexpected calcification. Skull: Normal. Negative for fracture or focal lesion. Sinuses/Orbits: Postsurgical changes from maxillary and ethmoid sinus surgery again noted. Chronic mucosal thickening throughout the ethmoid air cells. Other: None IMPRESSION: 1. Stable chronic small vessel ischemic changes. No acute intracranial process. Electronically Signed   By: Sharlet Salina M.D.   On: 01/30/2020 17:21   CT HEAD WO CONTRAST  Result Date: 01/22/2020 CLINICAL DATA:  Fall.  Altered mental status. EXAM: CT HEAD WITHOUT CONTRAST TECHNIQUE: Contiguous axial images were obtained from the base of the skull through the vertex without intravenous contrast. 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 There is atrophy and chronic small vessel disease changes. No acute intracranial abnormality. Specifically, no hemorrhage, hydrocephalus, mass lesion, acute infarction, or significant intracranial injury. Vascular: No hyperdense vessel or unexpected calcification. Skull: No acute calvarial abnormality. 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 Atrophy, chronic microvascular disease. No acute intracranial abnormality. Electronically Signed   By: Charlett Nose M.D.   On: 01/22/2020 16:38   MR BRAIN WO CONTRAST  Result Date: 01/30/2020 CLINICAL DATA:  Neuro deficit, acute, stroke suspected. EXAM: MRI HEAD WITHOUT CONTRAST TECHNIQUE: Multiplanar, multiecho pulse sequences of the brain and surrounding structures were obtained without intravenous contrast. COMPARISON:  Head CT 01/30/2020, brain MRI 01/22/2020. FINDINGS: The patient was unable to tolerate the full examination. As a result, only axial and coronal diffusion-weighted sequences as well as a moderately motion degraded sagittal T1 weighted sequence could be obtained. There is no evidence of acute infarct. No  focal marrow  lesion is identified within the limitations of motion degradation on the T1 weighted sagittal sequence. IMPRESSION: The patient was unable to tolerate the full examination and only diffusion-weighted imaging as well as a moderately motion degraded sagittal T1 weighted sequence could be obtained. No evidence of acute infarct. Electronically Signed   By: Jackey Loge DO   On: 01/30/2020 19:25   MR BRAIN WO CONTRAST  Result Date: 01/22/2020 CLINICAL DATA:  Slurred speech with multiple falls. History of Parkinson's disease. EXAM: MRI HEAD WITHOUT CONTRAST TECHNIQUE: Multiplanar, multiecho pulse sequences of the brain and surrounding structures were obtained without intravenous contrast. COMPARISON:  None. FINDINGS: Brain: No acute infarct, acute hemorrhage or extra-axial collection. Multifocal white matter hyperintensity, most commonly due to chronic ischemic microangiopathy. 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 No chronic microhemorrhage. Normal midline structures. Vascular: Normal flow voids. Skull and upper cervical spine: Normal marrow signal. Sinuses/Orbits: Negative. Other: None. IMPRESSION: 1. No acute intracranial abnormality. 2. Multifocal white matter hyperintensity, most commonly due to chronic ischemic microangiopathy. 3. 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 Electronically Signed   By: Deatra Robinson M.D.   On: 01/22/2020 20:25   DG Chest Portable 1 View  Result Date: 01/22/2020 CLINICAL DATA:  Fall EXAM: PORTABLE CHEST 1 VIEW COMPARISON:  05/26/2019 FINDINGS: The heart size and mediastinal contours are within normal limits. Both lungs are clear. The visualized skeletal structures are unremarkable. IMPRESSION: Normal study. Electronically Signed   By: Charlett Nose M.D.   On: 01/22/2020 17:33   DG Shoulder Left  Result Date: 01/30/2020 CLINICAL DATA:  Left lateral shoulder bruising after fall today EXAM: LEFT SHOULDER - 2+ VIEW COMPARISON:  None. FINDINGS: Frontal and  transscapular views of the left shoulder are obtained. No acute displaced fracture, subluxation, or dislocation. Mild acromioclavicular and glenohumeral osteoarthritis. Prominent spurring along the undersurface of the acromion process. Left chest is clear. IMPRESSION: 1. Degenerative changes of the left shoulder.  No acute fracture. Electronically Signed   By: Sharlet Salina M.D.   On: 01/30/2020 16:21   ECHOCARDIOGRAM COMPLETE  Result Date: 01/23/2020    ECHOCARDIOGRAM REPORT   Patient Name:   SKYLAN GIFT Date of Exam: 01/23/2020 Medical Rec #:  [id number]       Height:       63.0 in Accession #:    [id number]      Weight:       183.0 lb Date of Birth:  1951-05-11       BSA:          1.862 m Patient Age:    68 years        BP:           156/73 mmHg Patient Gender: F               HR:           62 bpm. Exam Location:  Inpatient Procedure: 2D Echo and Intracardiac Opacification Agent Indications:    TIA 435.9 / G45.9  History:        Patient has prior history of Echocardiogram examinations, most                 recent 08/23/2018. Risk Factors:Hypertension and Diabetes.                 Sarcoidosis Parkinson disease.  Sonographer:    Leta Jungling RDCS Referring Phys: [id number] Carlton Adam  Sonographer Comments: Unable to turn during echocardiogram. IMPRESSIONS  1.  Left ventricular ejection fraction, by estimation, is 60 to 65%. The left ventricle has normal function. The left ventricle has no regional wall motion abnormalities. There is moderate asymmetric left ventricular hypertrophy of the basal-septal segment. Left ventricular diastolic parameters are consistent with Grade I diastolic dysfunction (impaired relaxation).  2. Right ventricular systolic function is normal. The right ventricular size is normal. There is mildly elevated pulmonary artery systolic pressure. The estimated right ventricular systolic pressure is 45.0 mmHg.  3. The mitral valve is grossly normal. Mild mitral valve regurgitation. No  evidence of mitral stenosis.  4. The aortic valve is tricuspid. Aortic valve regurgitation is mild. Mild aortic valve sclerosis is present, with no evidence of aortic valve stenosis.  5. The inferior vena cava is normal in size with greater than 50% respiratory variability, suggesting right atrial pressure of 3 mmHg. Comparison(s): No significant change from prior study. Conclusion(s)/Recommendation(s): No intracardiac source of embolism detected on this transthoracic study. A transesophageal echocardiogram is recommended to exclude cardiac source of embolism if clinically indicated. FINDINGS  Left Ventricle: Left ventricular ejection fraction, by estimation, is 60 to 65%. The left ventricle has normal function. The left ventricle has no regional wall motion abnormalities. Definity contrast agent was given IV to delineate the left ventricular  endocardial borders. The left ventricular internal cavity size was normal in size. There is moderate asymmetric left ventricular hypertrophy of the basal-septal segment. Left ventricular diastolic parameters are consistent with Grade I diastolic dysfunction (impaired relaxation). Right Ventricle: The right ventricular size is normal. No increase in right ventricular wall thickness. Right ventricular systolic function is normal. There is mildly elevated pulmonary artery systolic pressure. The tricuspid regurgitant velocity is 3.24  m/s, and with an assumed right atrial pressure of 3 mmHg, the estimated right ventricular systolic pressure is 45.0 mmHg. Left Atrium: Left atrial size was normal in size. Right Atrium: Right atrial size was normal in size. Pericardium: Trivial pericardial effusion is present. Mitral Valve: The mitral valve is grossly normal. Mild mitral valve regurgitation. No evidence of mitral valve stenosis. Tricuspid Valve: The tricuspid valve is grossly normal. Tricuspid valve regurgitation is mild . No evidence of tricuspid stenosis. Aortic Valve: The aortic  valve is tricuspid. . There is mild thickening and mild calcification of the aortic valve. Aortic valve regurgitation is mild. Mild aortic valve sclerosis is present, with no evidence of aortic valve stenosis. There is mild thickening of the aortic valve. There is mild calcification of the aortic valve. Pulmonic Valve: The pulmonic valve was grossly normal. Pulmonic valve regurgitation is trivial. No evidence of pulmonic stenosis. Aorta: The aortic root and ascending aorta are structurally normal, with no evidence of dilitation. Venous: The inferior vena cava is normal in size with greater than 50% respiratory variability, suggesting right atrial pressure of 3 mmHg. IAS/Shunts: The atrial septum is grossly normal.  LEFT VENTRICLE PLAX 2D LVIDd:         3.50 cm  Diastology LVIDs:         2.20 cm  LV e' lateral:   7.62 cm/s LV PW:         1.30 cm  LV E/e' lateral: 9.1 LV IVS:        1.60 cm  LV e' medial:    5.87 cm/s LVOT diam:     1.90 cm  LV E/e' medial:  11.8 LV SV:         46 LV SV Index:   25 LVOT Area:     2.84  cm  RIGHT VENTRICLE RV S prime:     9.14 cm/s TAPSE (M-mode): 1.9 cm LEFT ATRIUM             Index       RIGHT ATRIUM           Index LA diam:        3.50 cm 1.88 cm/m  RA Area:     16.00 cm LA Vol (A2C):   42.0 ml 22.56 ml/m RA Volume:   38.60 ml  20.73 ml/m LA Vol (A4C):   30.1 ml 16.17 ml/m LA Biplane Vol: 36.8 ml 19.76 ml/m  AORTIC VALVE LVOT Vmax:   76.10 cm/s LVOT Vmean:  45.200 cm/s LVOT VTI:    0.164 m  AORTA Ao Root diam: 3.20 cm MITRAL VALVE               TRICUSPID VALVE MV Area (PHT): 3.85 cm    TR Peak grad:   42.0 mmHg MV Decel Time: 197 msec    TR Vmax:        324.00 cm/s MV E velocity: 69.40 cm/s MV A velocity: 69.40 cm/s  SHUNTS MV E/A ratio:  1.00        Systemic VTI:  0.16 m                            Systemic Diam: 1.90 cm Lennie Odor MD Electronically signed by Lennie Odor MD Signature Date/Time: 01/23/2020/3:18:15 PM    Final    VAS US CAROTID (at [facility] and [facility]  only)  Result Date: 01/23/2020 Carotid Arterial Duplex Study Indications:       TIA. Comparison Study:  No prior study Performing Technologist: Gertie Fey MHA, RDMS, RVT, RDCS  Examination Guidelines: A complete evaluation includes B-mode imaging, spectral Doppler, color Doppler, and power Doppler as needed of all accessible portions of each vessel. Bilateral testing is considered an integral part of a complete examination. Limited examinations for reoccurring indications may be performed as noted.  Right Carotid Findings: +----------+--------+-------+--------+--------------------------------+--------+           PSV cm/sEDV    StenosisPlaque Description              Comments                   cm/s                                                    +----------+--------+-------+--------+--------------------------------+--------+ CCA Prox  63      12                                                      +----------+--------+-------+--------+--------------------------------+--------+ CCA Distal50      7                                                       +----------+--------+-------+--------+--------------------------------+--------+ ICA Prox  188     59     40-59%  smooth, heterogenous and                                                  calcific                                 +----------+--------+-------+--------+--------------------------------+--------+ ICA Distal98      31                                                      +----------+--------+-------+--------+--------------------------------+--------+ ECA       63                     smooth, heterogenous and                                                  calcific                                 +----------+--------+-------+--------+--------------------------------+--------+ +----------+--------+-------+--------+-------------------+           PSV cm/sEDV cmsDescribeArm Pressure  (mmHG) +----------+--------+-------+--------+-------------------+ [id number]                                        +----------+--------+-------+--------+-------------------+ +---------+--------+--+--------+--+---------+ VertebralPSV cm/s55EDV cm/s15Antegrade +---------+--------+--+--------+--+---------+  Left Carotid Findings: +----------+-------+-------+--------+---------------------------------+--------+           PSV    EDV    StenosisPlaque Description               Comments           cm/s   cm/s                                                     +----------+-------+-------+--------+---------------------------------+--------+ CCA Prox  99     22                                                       +----------+-------+-------+--------+---------------------------------+--------+ CCA Distal80     21                                                       +----------+-------+-------+--------+---------------------------------+--------+ ICA Prox  59     20             heterogenous, calcific and  irregular                                 +----------+-------+-------+--------+---------------------------------+--------+ ICA Distal67     20                                                       +----------+-------+-------+--------+---------------------------------+--------+ ECA       63                                                              +----------+-------+-------+--------+---------------------------------+--------+ +----------+--------+--------+----------------+-------------------+           PSV cm/sEDV cm/sDescribe        Arm Pressure (mmHG) +----------+--------+--------+----------------+-------------------+ [id number]             Multiphasic, WNL                    +----------+--------+--------+----------------+-------------------+ +---------+--------+--+--------+--+---------+  VertebralPSV cm/s48EDV cm/s17Antegrade +---------+--------+--+--------+--+---------+   Summary: Right Carotid: 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 Left Carotid: Velocities in the left ICA are consistent with a 1-39% stenosis. Vertebrals:  Bilateral vertebral arteries demonstrate antegrade flow. Subclavians: Normal flow hemodynamics were seen in bilateral subclavian              arteries. *See table(s) above for measurements and observations.  Electronically signed by Fabienne Bruns MD on 01/23/2020 at 5:18:28 PM.    Final    ECHOCARDIOGRAM LIMITED  Result Date: 01/31/2020    ECHOCARDIOGRAM LIMITED REPORT   Patient Name:   SUNAINA FERRANDO Date of Exam: 01/31/2020 Medical Rec #:  [id number]       Height:       63.0 in Accession #:    [id number]      Weight:       183.0 lb Date of Birth:  02-09-1951       BSA:          1.862 m Patient Age:    68 years        BP:           160/83 mmHg Patient Gender: F               HR:           91 bpm. Exam Location:  Inpatient Procedure: Limited Echo, Limited Color Doppler and Cardiac Doppler Indications:    Dyspnea R06.00  History:        Patient has prior history of Echocardiogram examinations, most                 recent 01/23/2020. Risk Factors:Hypertension and Diabetes.  Sonographer:    Thurman Coyer RDCS (AE) Referring Phys: [id number] York Ram ARRIEN IMPRESSIONS  1. Left ventricular ejection fraction, by estimation, is 60 to 65%. The left ventricle has normal function. The left ventricle has no regional wall motion abnormalities. There is mild asymmetric left ventricular hypertrophy of the basal-septal segment.  2. Right ventricular systolic function is normal. The right ventricular  size is normal. There is moderately elevated pulmonary artery systolic pressure. The estimated right ventricular systolic pressure is 52.7 mmHg.  3. The mitral valve is normal in structure. Trivial mitral valve regurgitation.  4. The aortic  valve was not well visualized. Aortic valve regurgitation is trivial. Mild aortic valve sclerosis is present, with no evidence of aortic valve stenosis.  5. The inferior vena cava is dilated in size with <50% respiratory variability, suggesting right atrial pressure of 15 mmHg. FINDINGS  Left Ventricle: Left ventricular ejection fraction, by estimation, is 60 to 65%. The left ventricle has normal function. The left ventricle has no regional wall motion abnormalities. The left ventricular internal cavity size was normal in size. There is  mild asymmetric left ventricular hypertrophy of the basal-septal segment. Right Ventricle: The right ventricular size is normal. Right ventricular systolic function is normal. There is moderately elevated pulmonary artery systolic pressure. The tricuspid regurgitant velocity is 3.07 m/s, and with an assumed right atrial pressure of 15 mmHg, the estimated right ventricular systolic pressure is 52.7 mmHg. Pericardium: There is no evidence of pericardial effusion. Mitral Valve: The mitral valve is normal in structure. Trivial mitral valve regurgitation. Tricuspid Valve: The tricuspid valve is normal in structure. Tricuspid valve regurgitation is trivial. Aortic Valve: The aortic valve was not well visualized. Aortic valve regurgitation is trivial. Mild aortic valve sclerosis is present, with no evidence of aortic valve stenosis. Aorta: The aortic root and ascending aorta are structurally normal, with no evidence of dilitation. Venous: The inferior vena cava is dilated in size with less than 50% respiratory variability, suggesting right atrial pressure of 15 mmHg. LEFT VENTRICLE PLAX 2D LVIDd:         4.20 cm LVIDs:         2.70 cm LV PW:         0.90 cm LV IVS:        0.90 cm LVOT diam:     2.00 cm LVOT Area:     3.14 cm  LEFT ATRIUM         Index LA diam:    3.10 cm 1.66 cm/m   AORTA Ao Root diam: 3.00 cm Ao Asc diam:  3.20 cm TRICUSPID VALVE TR Peak grad:   37.7 mmHg TR Vmax:         307.00 cm/s  SHUNTS Systemic Diam: 2.00 cm Epifanio Lesches MD Electronically signed by Epifanio Lesches MD Signature Date/Time: 01/31/2020/5:03:39 PM    Final       Subjective: - no chest pain, shortness of breath, no abdominal pain, nausea or vomiting.   Discharge Exam: BP (!) 145/74 (BP Location: Left Arm)   Pulse 84   Temp 98.2 F (36.8 C) (Oral)   Resp 16   Ht 5\' 3"  (1.6 m)   Wt 83 kg   SpO2 96%   BMI 32.41 kg/m   General: Pt is alert, awake, not in acute distress Cardiovascular: RRR, S1/S2 +, no rubs, no gallops Respiratory: CTA bilaterally, no wheezing, no rhonchi Abdominal: Soft, NT, ND, bowel sounds + Extremities: no edema, no cyanosis    The results of significant diagnostics from this hospitalization (including imaging, microbiology, ancillary and laboratory) are listed below for reference.     Microbiology: Recent Results (from the past 240 hour(s))  SARS Coronavirus 2 by RT PCR (hospital order, performed in [facility] hospital lab) Nasopharyngeal Nasopharyngeal Swab     Status: None   Collection Time: 01/30/20  5:41 PM   Specimen: Nasopharyngeal  Swab  Result Value Ref Range Status   SARS Coronavirus 2 NEGATIVE NEGATIVE Final    Comment: (NOTE) SARS-CoV-2 target nucleic acids are NOT DETECTED.  The SARS-CoV-2 RNA is generally detectable in upper and lower respiratory specimens during the acute phase of infection. The lowest concentration of SARS-CoV-2 viral copies this assay can detect is 250 copies / mL. A negative result does not preclude SARS-CoV-2 infection and should not be used as the sole basis for treatment or other patient management decisions.  A negative result may occur with improper specimen collection / handling, submission of specimen other than nasopharyngeal swab, presence of viral mutation(s) within the areas targeted by this assay, and inadequate number of viral copies (<250 copies / mL). A negative result must be combined with  clinical observations, patient history, and epidemiological information.  Fact Sheet for Patients:   BoilerBrush.com.cyhttps://www.fda.gov/media/136312/download  Fact Sheet for Healthcare Providers: https://pope.com/https://www.fda.gov/media/136313/download  This test is not yet approved or  cleared by the [place] FDA and has been authorized for detection and/or diagnosis of SARS-CoV-2 by FDA under an Emergency Use Authorization (EUA).  This EUA will remain in effect (meaning this test can be used) for the duration of the COVID-19 declaration under Section 564(b)(1) of the Act, 21 U.S.C. section 360bbb-3(b)(1), unless the authorization is terminated or revoked sooner.  Performed at [facility], [address] [place], [place] [address]   Culture, Urine     Status: Abnormal   Collection Time: 02/01/20  7:22 AM   Specimen: Urine, Random  Result Value Ref Range Status   Specimen Description URINE, RANDOM  Final   Special Requests   Final    NONE Performed at [facility], [address] [place], [place] [address]    Culture (A)  Final    >=100,000 COLONIES/mL MULTIPLE SPECIES PRESENT, SUGGEST RECOLLECTION   Report Status 02/02/2020 FINAL  Final     Labs: Basic Metabolic Panel: Recent Labs  Lab 01/30/20 1628 01/31/20 0150 02/01/20 0143 02/04/20 0300  NA 138 140 137 138  K 5.3* 4.7 4.4 4.7  CL 95* 98 94* 89*  CO2 32 33* 35* 40*  GLUCOSE 242* 172* 193* 214*  BUN 46* 38* 19 23  CREATININE 1.76* 1.32* 0.82 1.03*  CALCIUM 9.2 8.9 9.2 9.7   Liver Function Tests: Recent Labs  Lab 01/30/20 1628  AST 30  ALT <5  ALKPHOS 169*  BILITOT 0.9  PROT 6.6  ALBUMIN 3.2*   CBC: Recent Labs  Lab 01/30/20 1628 01/31/20 0150  WBC 4.3 3.7*  NEUTROABS 3.0  --   HGB 9.9* 9.7*  HCT 34.2* 32.7*  MCV 98.8 99.1  PLT 178 153   CBG: No results for input(s): GLUCAP in the last 168 hours. Hgb A1c No results for input(s): HGBA1C in the last 72 hours. Lipid Profile No results for input(s):  CHOL, HDL, LDLCALC, TRIG, CHOLHDL, LDLDIRECT in the last 72 hours. Thyroid function studies No results for input(s): TSH, T4TOTAL, T3FREE, THYROIDAB in the last 72 hours.  Invalid input(s): FREET3 Urinalysis    Component Value Date/Time   COLORURINE AMBER (A) 01/30/2020 1755   APPEARANCEUR HAZY (A) 01/30/2020 1755   LABSPEC 1.019 01/30/2020 1755   PHURINE 5.0 01/30/2020 1755   GLUCOSEU NEGATIVE 01/30/2020 1755   HGBUR NEGATIVE 01/30/2020 1755   BILIRUBINUR NEGATIVE 01/30/2020 1755   KETONESUR 5 (A) 01/30/2020 1755   PROTEINUR 100 (A) 01/30/2020 1755   UROBILINOGEN 1.0 12/02/2012 1907   NITRITE NEGATIVE 01/30/2020  1755   LEUKOCYTESUR NEGATIVE 01/30/2020 1755    FURTHER DISCHARGE INSTRUCTIONS:   Get Medicines reviewed and adjusted: Please take all your medications with you for your next visit with your Primary MD   Laboratory/radiological data: Please request your Primary MD to go over all hospital tests and procedure/radiological results at the follow up, please ask your Primary MD to get all Hospital records sent to his/her office.   In some cases, they will be blood work, cultures and biopsy results pending at the time of your discharge. Please request that your primary care M.D. goes through all the records of your hospital data and follows up on these results.   Also Note the following: If you experience worsening of your admission symptoms, develop shortness of breath, life threatening emergency, suicidal or homicidal thoughts you must seek medical attention immediately by calling 911 or calling your MD immediately  if symptoms less severe.   You must read complete instructions/literature along with all the possible adverse reactions/side effects for all the Medicines you take and that have been prescribed to you. Take any new Medicines after you have completely understood and accpet all the possible adverse reactions/side effects.    Do not drive when taking Pain medications  or sleeping medications (Benzodaizepines)   Do not take more than prescribed Pain, Sleep and Anxiety Medications. It is not advisable to combine anxiety,sleep and pain medications without talking with your primary care practitioner   Special Instructions: If you have smoked or chewed Tobacco  in the last 2 yrs please stop smoking, stop any regular Alcohol  and or any Recreational drug use.   Wear Seat belts while driving.   Please note: You were cared for by a hospitalist during your hospital stay. Once you are discharged, your primary care physician will handle any further medical issues. Please note that NO REFILLS for any discharge medications will be authorized once you are discharged, as it is imperative that you return to your primary care physician (or establish a relationship with a primary care physician if you do not have one) for your post hospital discharge needs so that they can reassess your need for medications and monitor your lab values.  Time coordinating discharge: 35 minutes  SIGNED:  Pamella Pert, MD, PhD 02/04/2020, 9:12 AM

## 2020-11-12 IMAGING — CT CT HEAD W/O CM
4 series · 17 of 47 positions shown, 19 images · non-contrast
Comparison: 01/22/2020

CLINICAL DATA: Altered level of consciousness

EXAM:
CT HEAD WITHOUT CONTRAST
TECHNIQUE: Contiguous axial images were obtained from the base of the skull
through the vertex without intravenous contrast.

[Series 3: head without · axial · non-contrast · 0.44mm/px · z∈[-166,-41]mm · 7 of 35 slices shown, 9 images]
[im 5/35  brain]
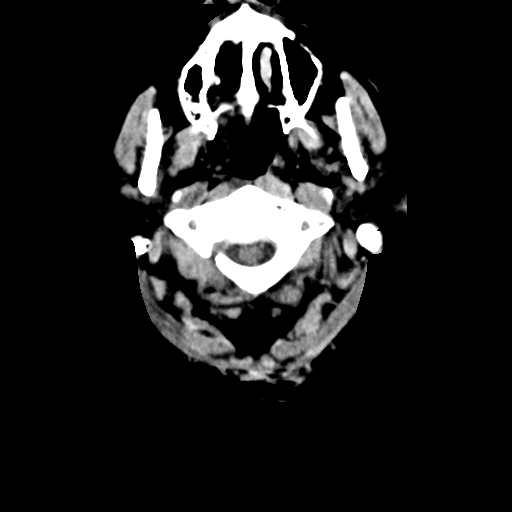
[im 5/35  bone]
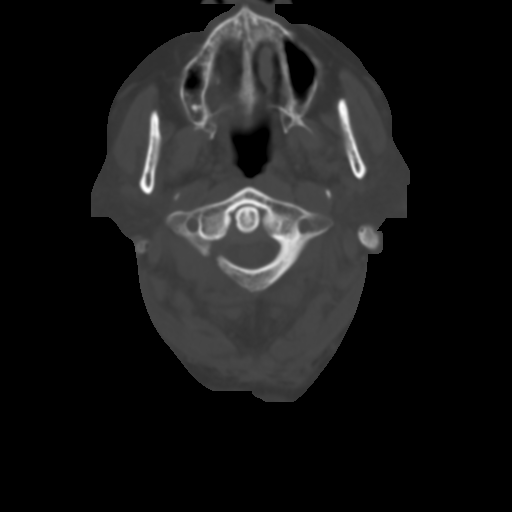
[im 9/35  brain]
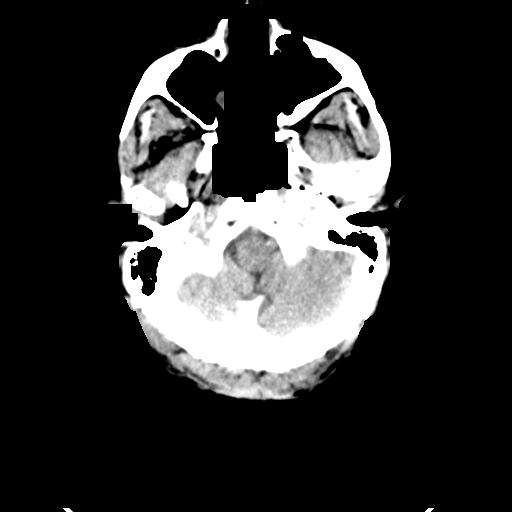
[im 13/35  brain]
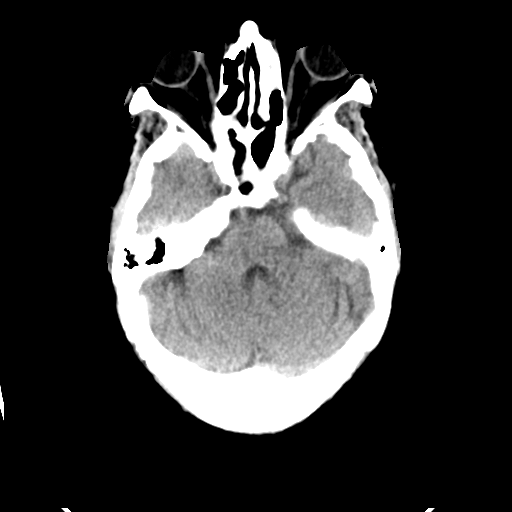
[im 18/35  brain]
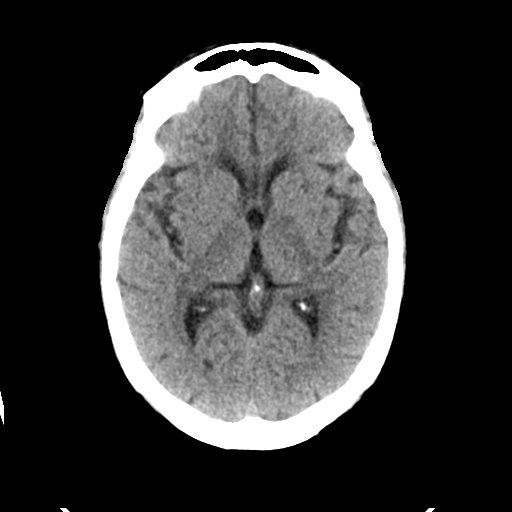
[im 22/35  brain]
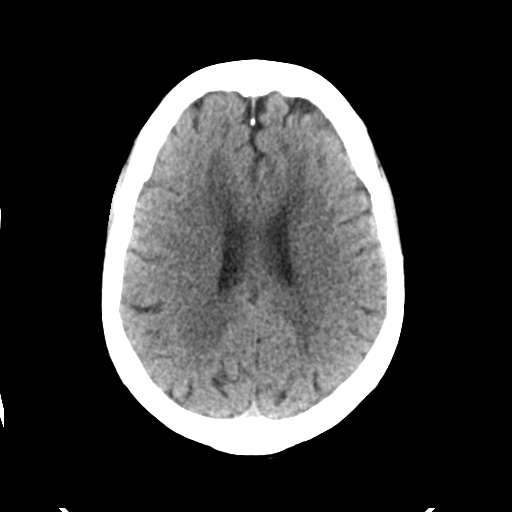
[im 22/35  bone]
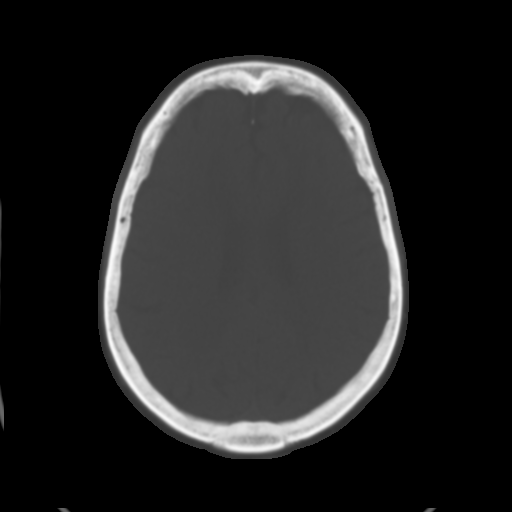
[im 26/35  brain]
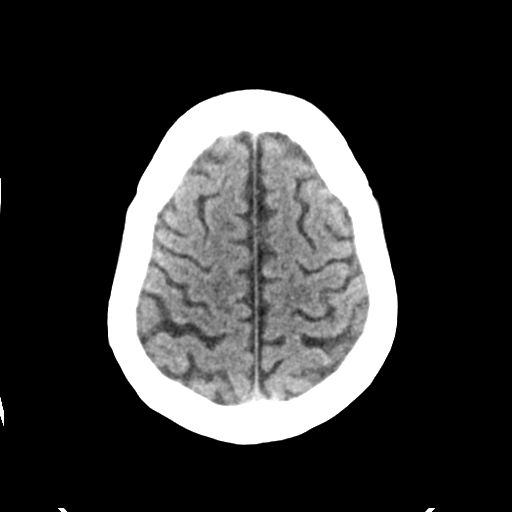
[im 30/35  brain]
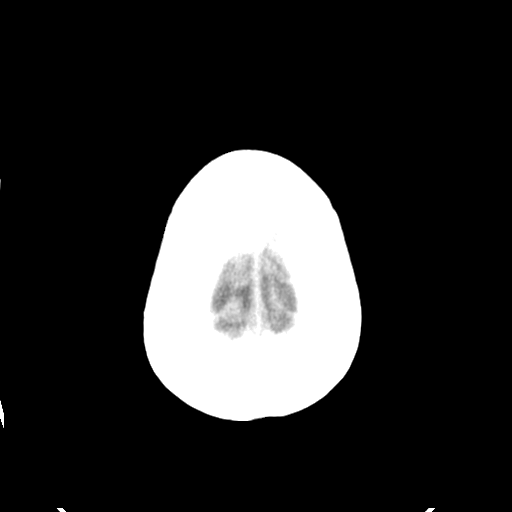

[Series 4: head bone · axial · 0.44mm/px · z∈[-170,-108]mm · 4 of 88 slices shown]
[im 9/88  bone]
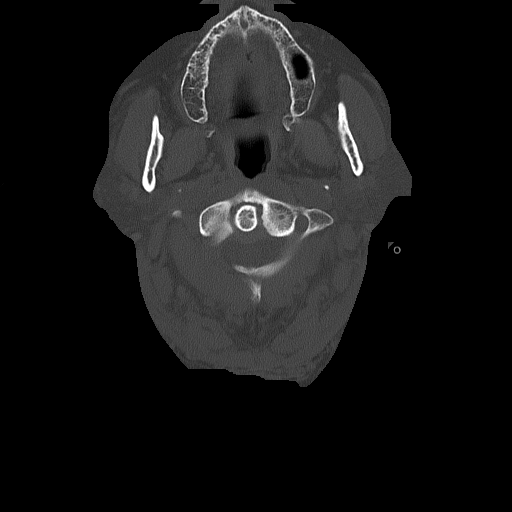
[im 18/88  bone]
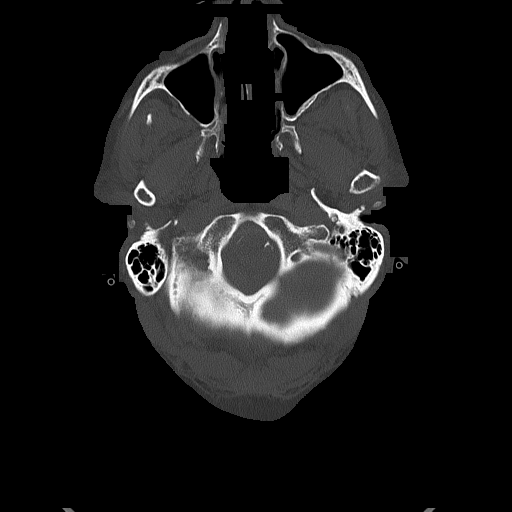
[im 27/88  bone]
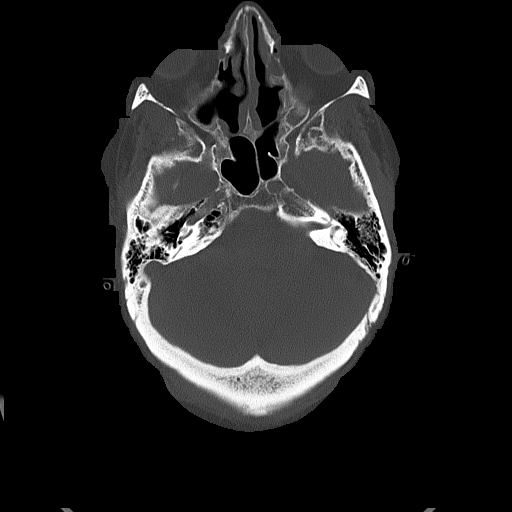
[im 40/88  bone]
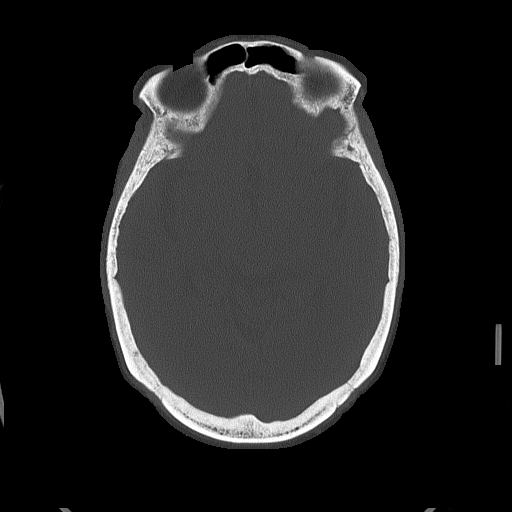

[Series 5: head without cor · coronal · non-contrast · 0.34mm/px · 3 of 70 slices shown]
[im 24/70  brain]
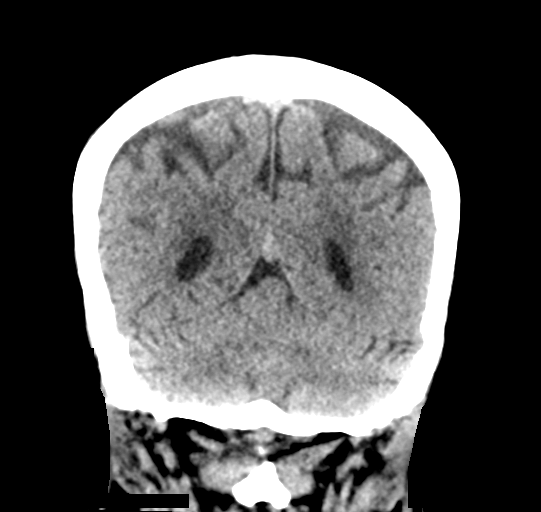
[im 31/70  brain]
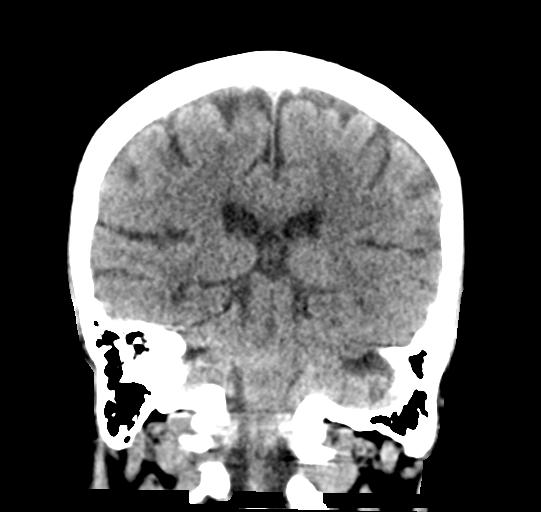
[im 39/70  brain]
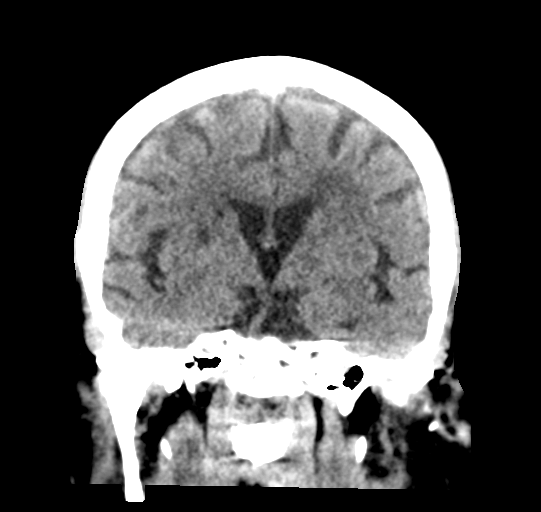

[Series 6: head without sag · sagittal · non-contrast · 0.34mm/px · 3 of 59 slices shown]
[im 20/59  brain]
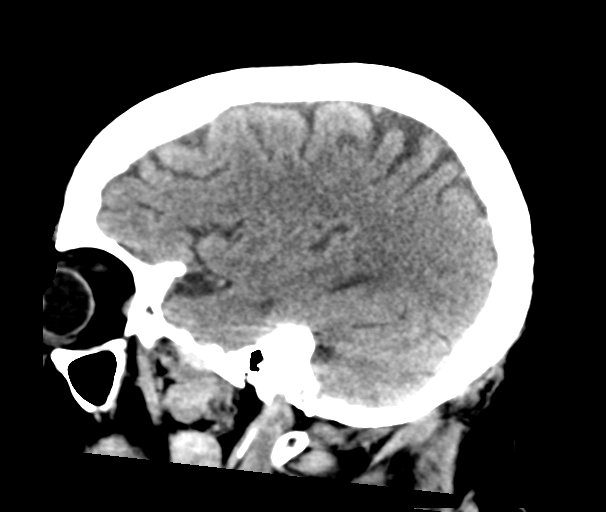
[im 30/59  brain]
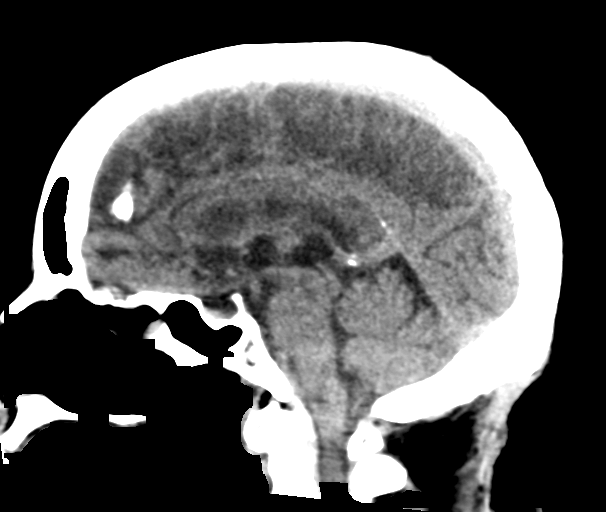
[im 39/59  brain]
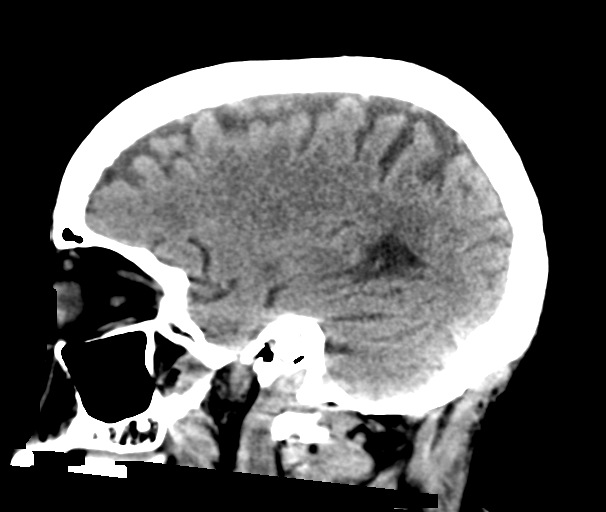

[17 of 47 positions shown; findings below may reference images not displayed]

FINDINGS: Brain: Stable hypodensities within the periventricular white matter
and bilateral basal ganglia consistent with chronic small vessel
ischemic changes. No acute infarct or hemorrhage. Lateral ventricles
and remaining midline structures are stable. No acute extra-axial
fluid collections. No mass effect.

Vascular: No hyperdense vessel or unexpected calcification.

Skull: Normal. Negative for fracture or focal lesion.

Sinuses/Orbits: Postsurgical changes from maxillary and ethmoid
sinus surgery again noted. Chronic mucosal thickening throughout the
ethmoid air cells.

Other: None
IMPRESSION: 1. Stable chronic small vessel ischemic changes. No acute
intracranial process.

## 2021-04-28 DIAGNOSIS — I1310 Hypertensive heart and chronic kidney disease without heart failure, with stage 1 through stage 4 chronic kidney disease, or unspecified chronic kidney disease: Secondary | ICD-10-CM | POA: Insufficient documentation

## 2021-04-28 DIAGNOSIS — J9690 Respiratory failure, unspecified, unspecified whether with hypoxia or hypercapnia: Secondary | ICD-10-CM | POA: Insufficient documentation

## 2021-04-28 DIAGNOSIS — J441 Chronic obstructive pulmonary disease with (acute) exacerbation: Secondary | ICD-10-CM | POA: Insufficient documentation

## 2021-04-28 DIAGNOSIS — R918 Other nonspecific abnormal finding of lung field: Secondary | ICD-10-CM | POA: Insufficient documentation

## 2021-09-06 DIAGNOSIS — C50912 Malignant neoplasm of unspecified site of left female breast: Secondary | ICD-10-CM | POA: Insufficient documentation

## 2021-09-23 DIAGNOSIS — Z9889 Other specified postprocedural states: Secondary | ICD-10-CM | POA: Insufficient documentation

## 2021-09-23 HISTORY — PX: BREAST LUMPECTOMY: SHX2

## 2021-11-16 DIAGNOSIS — M1712 Unilateral primary osteoarthritis, left knee: Secondary | ICD-10-CM | POA: Insufficient documentation

## 2021-12-18 DIAGNOSIS — S0285XA Fracture of orbit, unspecified, initial encounter for closed fracture: Secondary | ICD-10-CM | POA: Insufficient documentation

## 2022-08-29 DIAGNOSIS — N1419 Nephropathy induced by other drugs, medicaments and biological substances: Secondary | ICD-10-CM | POA: Insufficient documentation

## 2023-01-19 HISTORY — PX: TOTAL KNEE ARTHROPLASTY: SHX125

## 2023-02-13 DIAGNOSIS — Z471 Aftercare following joint replacement surgery: Secondary | ICD-10-CM | POA: Insufficient documentation

## 2023-03-10 DIAGNOSIS — H04123 Dry eye syndrome of bilateral lacrimal glands: Secondary | ICD-10-CM | POA: Insufficient documentation

## 2023-03-10 DIAGNOSIS — H524 Presbyopia: Secondary | ICD-10-CM | POA: Insufficient documentation

## 2023-03-10 DIAGNOSIS — H35373 Puckering of macula, bilateral: Secondary | ICD-10-CM | POA: Insufficient documentation

## 2023-03-10 DIAGNOSIS — H18413 Arcus senilis, bilateral: Secondary | ICD-10-CM | POA: Insufficient documentation

## 2023-03-10 DIAGNOSIS — H43813 Vitreous degeneration, bilateral: Secondary | ICD-10-CM | POA: Insufficient documentation

## 2023-03-10 DIAGNOSIS — Z961 Presence of intraocular lens: Secondary | ICD-10-CM | POA: Insufficient documentation

## 2023-03-10 DIAGNOSIS — H2512 Age-related nuclear cataract, left eye: Secondary | ICD-10-CM | POA: Insufficient documentation

## 2023-03-10 DIAGNOSIS — H02831 Dermatochalasis of right upper eyelid: Secondary | ICD-10-CM | POA: Insufficient documentation

## 2023-04-07 DIAGNOSIS — Z7409 Other reduced mobility: Secondary | ICD-10-CM | POA: Insufficient documentation

## 2023-08-20 DIAGNOSIS — R0902 Hypoxemia: Secondary | ICD-10-CM | POA: Diagnosis not present

## 2023-08-20 DIAGNOSIS — J4489 Other specified chronic obstructive pulmonary disease: Secondary | ICD-10-CM | POA: Diagnosis not present

## 2023-08-20 DIAGNOSIS — D869 Sarcoidosis, unspecified: Secondary | ICD-10-CM | POA: Diagnosis not present

## 2023-11-27 DIAGNOSIS — N1831 Chronic kidney disease, stage 3a: Secondary | ICD-10-CM | POA: Diagnosis not present

## 2023-11-27 DIAGNOSIS — R262 Difficulty in walking, not elsewhere classified: Secondary | ICD-10-CM | POA: Diagnosis not present

## 2023-11-27 DIAGNOSIS — R0902 Hypoxemia: Secondary | ICD-10-CM | POA: Diagnosis not present

## 2023-11-27 DIAGNOSIS — Z9981 Dependence on supplemental oxygen: Secondary | ICD-10-CM | POA: Diagnosis not present

## 2023-11-27 DIAGNOSIS — R790 Abnormal level of blood mineral: Secondary | ICD-10-CM | POA: Diagnosis not present

## 2023-11-27 DIAGNOSIS — E1122 Type 2 diabetes mellitus with diabetic chronic kidney disease: Secondary | ICD-10-CM | POA: Diagnosis not present

## 2023-11-27 DIAGNOSIS — Z91148 Patient's other noncompliance with medication regimen for other reason: Secondary | ICD-10-CM | POA: Diagnosis not present

## 2023-11-27 DIAGNOSIS — I129 Hypertensive chronic kidney disease with stage 1 through stage 4 chronic kidney disease, or unspecified chronic kidney disease: Secondary | ICD-10-CM | POA: Diagnosis not present

## 2023-11-27 DIAGNOSIS — J449 Chronic obstructive pulmonary disease, unspecified: Secondary | ICD-10-CM | POA: Diagnosis not present

## 2023-11-27 DIAGNOSIS — I10 Essential (primary) hypertension: Secondary | ICD-10-CM | POA: Diagnosis not present

## 2023-11-27 DIAGNOSIS — J4489 Other specified chronic obstructive pulmonary disease: Secondary | ICD-10-CM | POA: Diagnosis not present

## 2023-11-27 DIAGNOSIS — E782 Mixed hyperlipidemia: Secondary | ICD-10-CM | POA: Diagnosis not present

## 2023-12-11 DIAGNOSIS — Z556 Problems related to health literacy: Secondary | ICD-10-CM | POA: Diagnosis not present

## 2023-12-11 DIAGNOSIS — M51369 Other intervertebral disc degeneration, lumbar region without mention of lumbar back pain or lower extremity pain: Secondary | ICD-10-CM | POA: Diagnosis not present

## 2023-12-11 DIAGNOSIS — E1122 Type 2 diabetes mellitus with diabetic chronic kidney disease: Secondary | ICD-10-CM | POA: Diagnosis not present

## 2023-12-11 DIAGNOSIS — Z79899 Other long term (current) drug therapy: Secondary | ICD-10-CM | POA: Diagnosis not present

## 2023-12-11 DIAGNOSIS — Z7951 Long term (current) use of inhaled steroids: Secondary | ICD-10-CM | POA: Diagnosis not present

## 2023-12-11 DIAGNOSIS — H9190 Unspecified hearing loss, unspecified ear: Secondary | ICD-10-CM | POA: Diagnosis not present

## 2023-12-11 DIAGNOSIS — J4489 Other specified chronic obstructive pulmonary disease: Secondary | ICD-10-CM | POA: Diagnosis not present

## 2023-12-11 DIAGNOSIS — Z9012 Acquired absence of left breast and nipple: Secondary | ICD-10-CM | POA: Diagnosis not present

## 2023-12-11 DIAGNOSIS — Z853 Personal history of malignant neoplasm of breast: Secondary | ICD-10-CM | POA: Diagnosis not present

## 2023-12-11 DIAGNOSIS — I10 Essential (primary) hypertension: Secondary | ICD-10-CM | POA: Diagnosis not present

## 2023-12-11 DIAGNOSIS — Z8673 Personal history of transient ischemic attack (TIA), and cerebral infarction without residual deficits: Secondary | ICD-10-CM | POA: Diagnosis not present

## 2023-12-11 DIAGNOSIS — I4891 Unspecified atrial fibrillation: Secondary | ICD-10-CM | POA: Diagnosis not present

## 2023-12-11 DIAGNOSIS — Z9981 Dependence on supplemental oxygen: Secondary | ICD-10-CM | POA: Diagnosis not present

## 2023-12-11 DIAGNOSIS — G20A1 Parkinson's disease without dyskinesia, without mention of fluctuations: Secondary | ICD-10-CM | POA: Diagnosis not present

## 2023-12-11 DIAGNOSIS — G250 Essential tremor: Secondary | ICD-10-CM | POA: Diagnosis not present

## 2023-12-11 DIAGNOSIS — Z96652 Presence of left artificial knee joint: Secondary | ICD-10-CM | POA: Diagnosis not present

## 2023-12-11 DIAGNOSIS — M8580 Other specified disorders of bone density and structure, unspecified site: Secondary | ICD-10-CM | POA: Diagnosis not present

## 2023-12-11 DIAGNOSIS — Z7984 Long term (current) use of oral hypoglycemic drugs: Secondary | ICD-10-CM | POA: Diagnosis not present

## 2023-12-11 DIAGNOSIS — N1832 Chronic kidney disease, stage 3b: Secondary | ICD-10-CM | POA: Diagnosis not present

## 2023-12-11 DIAGNOSIS — M1711 Unilateral primary osteoarthritis, right knee: Secondary | ICD-10-CM | POA: Diagnosis not present

## 2023-12-11 DIAGNOSIS — E782 Mixed hyperlipidemia: Secondary | ICD-10-CM | POA: Diagnosis not present

## 2023-12-17 DIAGNOSIS — Z9981 Dependence on supplemental oxygen: Secondary | ICD-10-CM | POA: Diagnosis not present

## 2023-12-17 DIAGNOSIS — G20A1 Parkinson's disease without dyskinesia, without mention of fluctuations: Secondary | ICD-10-CM | POA: Diagnosis not present

## 2023-12-17 DIAGNOSIS — E1122 Type 2 diabetes mellitus with diabetic chronic kidney disease: Secondary | ICD-10-CM | POA: Diagnosis not present

## 2023-12-17 DIAGNOSIS — M8580 Other specified disorders of bone density and structure, unspecified site: Secondary | ICD-10-CM | POA: Diagnosis not present

## 2023-12-17 DIAGNOSIS — Z556 Problems related to health literacy: Secondary | ICD-10-CM | POA: Diagnosis not present

## 2023-12-17 DIAGNOSIS — Z79899 Other long term (current) drug therapy: Secondary | ICD-10-CM | POA: Diagnosis not present

## 2023-12-17 DIAGNOSIS — Z7951 Long term (current) use of inhaled steroids: Secondary | ICD-10-CM | POA: Diagnosis not present

## 2023-12-17 DIAGNOSIS — I4891 Unspecified atrial fibrillation: Secondary | ICD-10-CM | POA: Diagnosis not present

## 2023-12-17 DIAGNOSIS — M1711 Unilateral primary osteoarthritis, right knee: Secondary | ICD-10-CM | POA: Diagnosis not present

## 2023-12-17 DIAGNOSIS — Z7984 Long term (current) use of oral hypoglycemic drugs: Secondary | ICD-10-CM | POA: Diagnosis not present

## 2023-12-17 DIAGNOSIS — H9190 Unspecified hearing loss, unspecified ear: Secondary | ICD-10-CM | POA: Diagnosis not present

## 2023-12-17 DIAGNOSIS — Z96652 Presence of left artificial knee joint: Secondary | ICD-10-CM | POA: Diagnosis not present

## 2023-12-17 DIAGNOSIS — Z8673 Personal history of transient ischemic attack (TIA), and cerebral infarction without residual deficits: Secondary | ICD-10-CM | POA: Diagnosis not present

## 2023-12-17 DIAGNOSIS — J4489 Other specified chronic obstructive pulmonary disease: Secondary | ICD-10-CM | POA: Diagnosis not present

## 2023-12-17 DIAGNOSIS — I10 Essential (primary) hypertension: Secondary | ICD-10-CM | POA: Diagnosis not present

## 2023-12-17 DIAGNOSIS — N1832 Chronic kidney disease, stage 3b: Secondary | ICD-10-CM | POA: Diagnosis not present

## 2023-12-17 DIAGNOSIS — M51369 Other intervertebral disc degeneration, lumbar region without mention of lumbar back pain or lower extremity pain: Secondary | ICD-10-CM | POA: Diagnosis not present

## 2023-12-17 DIAGNOSIS — Z9012 Acquired absence of left breast and nipple: Secondary | ICD-10-CM | POA: Diagnosis not present

## 2023-12-17 DIAGNOSIS — Z853 Personal history of malignant neoplasm of breast: Secondary | ICD-10-CM | POA: Diagnosis not present

## 2023-12-17 DIAGNOSIS — E782 Mixed hyperlipidemia: Secondary | ICD-10-CM | POA: Diagnosis not present

## 2023-12-17 DIAGNOSIS — G250 Essential tremor: Secondary | ICD-10-CM | POA: Diagnosis not present

## 2023-12-21 DIAGNOSIS — Z96652 Presence of left artificial knee joint: Secondary | ICD-10-CM | POA: Diagnosis not present

## 2023-12-21 DIAGNOSIS — G20A1 Parkinson's disease without dyskinesia, without mention of fluctuations: Secondary | ICD-10-CM | POA: Diagnosis not present

## 2023-12-21 DIAGNOSIS — I10 Essential (primary) hypertension: Secondary | ICD-10-CM | POA: Diagnosis not present

## 2023-12-21 DIAGNOSIS — Z9981 Dependence on supplemental oxygen: Secondary | ICD-10-CM | POA: Diagnosis not present

## 2023-12-21 DIAGNOSIS — Z9012 Acquired absence of left breast and nipple: Secondary | ICD-10-CM | POA: Diagnosis not present

## 2023-12-21 DIAGNOSIS — Z7951 Long term (current) use of inhaled steroids: Secondary | ICD-10-CM | POA: Diagnosis not present

## 2023-12-21 DIAGNOSIS — E1122 Type 2 diabetes mellitus with diabetic chronic kidney disease: Secondary | ICD-10-CM | POA: Diagnosis not present

## 2023-12-21 DIAGNOSIS — G250 Essential tremor: Secondary | ICD-10-CM | POA: Diagnosis not present

## 2023-12-21 DIAGNOSIS — N1832 Chronic kidney disease, stage 3b: Secondary | ICD-10-CM | POA: Diagnosis not present

## 2023-12-21 DIAGNOSIS — H9190 Unspecified hearing loss, unspecified ear: Secondary | ICD-10-CM | POA: Diagnosis not present

## 2023-12-21 DIAGNOSIS — I4891 Unspecified atrial fibrillation: Secondary | ICD-10-CM | POA: Diagnosis not present

## 2023-12-21 DIAGNOSIS — E782 Mixed hyperlipidemia: Secondary | ICD-10-CM | POA: Diagnosis not present

## 2023-12-21 DIAGNOSIS — Z556 Problems related to health literacy: Secondary | ICD-10-CM | POA: Diagnosis not present

## 2023-12-21 DIAGNOSIS — Z7984 Long term (current) use of oral hypoglycemic drugs: Secondary | ICD-10-CM | POA: Diagnosis not present

## 2023-12-21 DIAGNOSIS — J4489 Other specified chronic obstructive pulmonary disease: Secondary | ICD-10-CM | POA: Diagnosis not present

## 2023-12-21 DIAGNOSIS — M8580 Other specified disorders of bone density and structure, unspecified site: Secondary | ICD-10-CM | POA: Diagnosis not present

## 2023-12-21 DIAGNOSIS — Z8673 Personal history of transient ischemic attack (TIA), and cerebral infarction without residual deficits: Secondary | ICD-10-CM | POA: Diagnosis not present

## 2023-12-21 DIAGNOSIS — M51369 Other intervertebral disc degeneration, lumbar region without mention of lumbar back pain or lower extremity pain: Secondary | ICD-10-CM | POA: Diagnosis not present

## 2023-12-21 DIAGNOSIS — Z79899 Other long term (current) drug therapy: Secondary | ICD-10-CM | POA: Diagnosis not present

## 2023-12-21 DIAGNOSIS — M1711 Unilateral primary osteoarthritis, right knee: Secondary | ICD-10-CM | POA: Diagnosis not present

## 2023-12-21 DIAGNOSIS — Z853 Personal history of malignant neoplasm of breast: Secondary | ICD-10-CM | POA: Diagnosis not present

## 2023-12-29 DIAGNOSIS — J4489 Other specified chronic obstructive pulmonary disease: Secondary | ICD-10-CM | POA: Diagnosis not present

## 2023-12-29 DIAGNOSIS — M1711 Unilateral primary osteoarthritis, right knee: Secondary | ICD-10-CM | POA: Diagnosis not present

## 2023-12-29 DIAGNOSIS — Z853 Personal history of malignant neoplasm of breast: Secondary | ICD-10-CM | POA: Diagnosis not present

## 2023-12-29 DIAGNOSIS — Z7951 Long term (current) use of inhaled steroids: Secondary | ICD-10-CM | POA: Diagnosis not present

## 2023-12-29 DIAGNOSIS — N1832 Chronic kidney disease, stage 3b: Secondary | ICD-10-CM | POA: Diagnosis not present

## 2023-12-29 DIAGNOSIS — E1122 Type 2 diabetes mellitus with diabetic chronic kidney disease: Secondary | ICD-10-CM | POA: Diagnosis not present

## 2023-12-29 DIAGNOSIS — Z9012 Acquired absence of left breast and nipple: Secondary | ICD-10-CM | POA: Diagnosis not present

## 2023-12-29 DIAGNOSIS — Z556 Problems related to health literacy: Secondary | ICD-10-CM | POA: Diagnosis not present

## 2023-12-29 DIAGNOSIS — G20A1 Parkinson's disease without dyskinesia, without mention of fluctuations: Secondary | ICD-10-CM | POA: Diagnosis not present

## 2023-12-29 DIAGNOSIS — Z79899 Other long term (current) drug therapy: Secondary | ICD-10-CM | POA: Diagnosis not present

## 2023-12-29 DIAGNOSIS — I4891 Unspecified atrial fibrillation: Secondary | ICD-10-CM | POA: Diagnosis not present

## 2023-12-29 DIAGNOSIS — M51369 Other intervertebral disc degeneration, lumbar region without mention of lumbar back pain or lower extremity pain: Secondary | ICD-10-CM | POA: Diagnosis not present

## 2023-12-29 DIAGNOSIS — I10 Essential (primary) hypertension: Secondary | ICD-10-CM | POA: Diagnosis not present

## 2023-12-29 DIAGNOSIS — Z96652 Presence of left artificial knee joint: Secondary | ICD-10-CM | POA: Diagnosis not present

## 2023-12-29 DIAGNOSIS — E782 Mixed hyperlipidemia: Secondary | ICD-10-CM | POA: Diagnosis not present

## 2023-12-29 DIAGNOSIS — Z7984 Long term (current) use of oral hypoglycemic drugs: Secondary | ICD-10-CM | POA: Diagnosis not present

## 2023-12-29 DIAGNOSIS — H9190 Unspecified hearing loss, unspecified ear: Secondary | ICD-10-CM | POA: Diagnosis not present

## 2023-12-29 DIAGNOSIS — Z9981 Dependence on supplemental oxygen: Secondary | ICD-10-CM | POA: Diagnosis not present

## 2023-12-29 DIAGNOSIS — G250 Essential tremor: Secondary | ICD-10-CM | POA: Diagnosis not present

## 2023-12-29 DIAGNOSIS — Z8673 Personal history of transient ischemic attack (TIA), and cerebral infarction without residual deficits: Secondary | ICD-10-CM | POA: Diagnosis not present

## 2023-12-29 DIAGNOSIS — M8580 Other specified disorders of bone density and structure, unspecified site: Secondary | ICD-10-CM | POA: Diagnosis not present

## 2024-01-05 DIAGNOSIS — M1711 Unilateral primary osteoarthritis, right knee: Secondary | ICD-10-CM | POA: Diagnosis not present

## 2024-01-05 DIAGNOSIS — M8580 Other specified disorders of bone density and structure, unspecified site: Secondary | ICD-10-CM | POA: Diagnosis not present

## 2024-01-05 DIAGNOSIS — E1122 Type 2 diabetes mellitus with diabetic chronic kidney disease: Secondary | ICD-10-CM | POA: Diagnosis not present

## 2024-01-05 DIAGNOSIS — Z9012 Acquired absence of left breast and nipple: Secondary | ICD-10-CM | POA: Diagnosis not present

## 2024-01-05 DIAGNOSIS — Z8673 Personal history of transient ischemic attack (TIA), and cerebral infarction without residual deficits: Secondary | ICD-10-CM | POA: Diagnosis not present

## 2024-01-05 DIAGNOSIS — Z556 Problems related to health literacy: Secondary | ICD-10-CM | POA: Diagnosis not present

## 2024-01-05 DIAGNOSIS — Z96652 Presence of left artificial knee joint: Secondary | ICD-10-CM | POA: Diagnosis not present

## 2024-01-05 DIAGNOSIS — H9190 Unspecified hearing loss, unspecified ear: Secondary | ICD-10-CM | POA: Diagnosis not present

## 2024-01-05 DIAGNOSIS — G250 Essential tremor: Secondary | ICD-10-CM | POA: Diagnosis not present

## 2024-01-05 DIAGNOSIS — Z7984 Long term (current) use of oral hypoglycemic drugs: Secondary | ICD-10-CM | POA: Diagnosis not present

## 2024-01-05 DIAGNOSIS — J4489 Other specified chronic obstructive pulmonary disease: Secondary | ICD-10-CM | POA: Diagnosis not present

## 2024-01-05 DIAGNOSIS — M51369 Other intervertebral disc degeneration, lumbar region without mention of lumbar back pain or lower extremity pain: Secondary | ICD-10-CM | POA: Diagnosis not present

## 2024-01-05 DIAGNOSIS — I10 Essential (primary) hypertension: Secondary | ICD-10-CM | POA: Diagnosis not present

## 2024-01-05 DIAGNOSIS — I4891 Unspecified atrial fibrillation: Secondary | ICD-10-CM | POA: Diagnosis not present

## 2024-01-05 DIAGNOSIS — Z9981 Dependence on supplemental oxygen: Secondary | ICD-10-CM | POA: Diagnosis not present

## 2024-01-05 DIAGNOSIS — Z79899 Other long term (current) drug therapy: Secondary | ICD-10-CM | POA: Diagnosis not present

## 2024-01-05 DIAGNOSIS — Z853 Personal history of malignant neoplasm of breast: Secondary | ICD-10-CM | POA: Diagnosis not present

## 2024-01-05 DIAGNOSIS — G20A1 Parkinson's disease without dyskinesia, without mention of fluctuations: Secondary | ICD-10-CM | POA: Diagnosis not present

## 2024-01-05 DIAGNOSIS — N1832 Chronic kidney disease, stage 3b: Secondary | ICD-10-CM | POA: Diagnosis not present

## 2024-01-05 DIAGNOSIS — E782 Mixed hyperlipidemia: Secondary | ICD-10-CM | POA: Diagnosis not present

## 2024-01-05 DIAGNOSIS — Z7951 Long term (current) use of inhaled steroids: Secondary | ICD-10-CM | POA: Diagnosis not present

## 2024-01-12 DIAGNOSIS — Z79899 Other long term (current) drug therapy: Secondary | ICD-10-CM | POA: Diagnosis not present

## 2024-01-12 DIAGNOSIS — M1711 Unilateral primary osteoarthritis, right knee: Secondary | ICD-10-CM | POA: Diagnosis not present

## 2024-01-12 DIAGNOSIS — E1122 Type 2 diabetes mellitus with diabetic chronic kidney disease: Secondary | ICD-10-CM | POA: Diagnosis not present

## 2024-01-12 DIAGNOSIS — Z556 Problems related to health literacy: Secondary | ICD-10-CM | POA: Diagnosis not present

## 2024-01-12 DIAGNOSIS — Z7984 Long term (current) use of oral hypoglycemic drugs: Secondary | ICD-10-CM | POA: Diagnosis not present

## 2024-01-12 DIAGNOSIS — Z96652 Presence of left artificial knee joint: Secondary | ICD-10-CM | POA: Diagnosis not present

## 2024-01-12 DIAGNOSIS — H9190 Unspecified hearing loss, unspecified ear: Secondary | ICD-10-CM | POA: Diagnosis not present

## 2024-01-12 DIAGNOSIS — I10 Essential (primary) hypertension: Secondary | ICD-10-CM | POA: Diagnosis not present

## 2024-01-12 DIAGNOSIS — Z7951 Long term (current) use of inhaled steroids: Secondary | ICD-10-CM | POA: Diagnosis not present

## 2024-01-12 DIAGNOSIS — E782 Mixed hyperlipidemia: Secondary | ICD-10-CM | POA: Diagnosis not present

## 2024-01-12 DIAGNOSIS — I4891 Unspecified atrial fibrillation: Secondary | ICD-10-CM | POA: Diagnosis not present

## 2024-01-12 DIAGNOSIS — M51369 Other intervertebral disc degeneration, lumbar region without mention of lumbar back pain or lower extremity pain: Secondary | ICD-10-CM | POA: Diagnosis not present

## 2024-01-12 DIAGNOSIS — N1832 Chronic kidney disease, stage 3b: Secondary | ICD-10-CM | POA: Diagnosis not present

## 2024-01-12 DIAGNOSIS — Z8673 Personal history of transient ischemic attack (TIA), and cerebral infarction without residual deficits: Secondary | ICD-10-CM | POA: Diagnosis not present

## 2024-01-12 DIAGNOSIS — G20A1 Parkinson's disease without dyskinesia, without mention of fluctuations: Secondary | ICD-10-CM | POA: Diagnosis not present

## 2024-01-12 DIAGNOSIS — Z9012 Acquired absence of left breast and nipple: Secondary | ICD-10-CM | POA: Diagnosis not present

## 2024-01-12 DIAGNOSIS — J4489 Other specified chronic obstructive pulmonary disease: Secondary | ICD-10-CM | POA: Diagnosis not present

## 2024-01-12 DIAGNOSIS — M8580 Other specified disorders of bone density and structure, unspecified site: Secondary | ICD-10-CM | POA: Diagnosis not present

## 2024-01-12 DIAGNOSIS — G250 Essential tremor: Secondary | ICD-10-CM | POA: Diagnosis not present

## 2024-01-12 DIAGNOSIS — Z853 Personal history of malignant neoplasm of breast: Secondary | ICD-10-CM | POA: Diagnosis not present

## 2024-01-12 DIAGNOSIS — Z9981 Dependence on supplemental oxygen: Secondary | ICD-10-CM | POA: Diagnosis not present

## 2024-01-13 ENCOUNTER — Encounter: Payer: Self-pay | Admitting: Family Medicine

## 2024-01-13 ENCOUNTER — Ambulatory Visit: Admitting: Family Medicine

## 2024-01-20 DIAGNOSIS — Z7984 Long term (current) use of oral hypoglycemic drugs: Secondary | ICD-10-CM | POA: Diagnosis not present

## 2024-01-20 DIAGNOSIS — Z79899 Other long term (current) drug therapy: Secondary | ICD-10-CM | POA: Diagnosis not present

## 2024-01-20 DIAGNOSIS — H9190 Unspecified hearing loss, unspecified ear: Secondary | ICD-10-CM | POA: Diagnosis not present

## 2024-01-20 DIAGNOSIS — Z8673 Personal history of transient ischemic attack (TIA), and cerebral infarction without residual deficits: Secondary | ICD-10-CM | POA: Diagnosis not present

## 2024-01-20 DIAGNOSIS — Z9012 Acquired absence of left breast and nipple: Secondary | ICD-10-CM | POA: Diagnosis not present

## 2024-01-20 DIAGNOSIS — E782 Mixed hyperlipidemia: Secondary | ICD-10-CM | POA: Diagnosis not present

## 2024-01-20 DIAGNOSIS — G20A1 Parkinson's disease without dyskinesia, without mention of fluctuations: Secondary | ICD-10-CM | POA: Diagnosis not present

## 2024-01-20 DIAGNOSIS — Z556 Problems related to health literacy: Secondary | ICD-10-CM | POA: Diagnosis not present

## 2024-01-20 DIAGNOSIS — M51369 Other intervertebral disc degeneration, lumbar region without mention of lumbar back pain or lower extremity pain: Secondary | ICD-10-CM | POA: Diagnosis not present

## 2024-01-20 DIAGNOSIS — E1122 Type 2 diabetes mellitus with diabetic chronic kidney disease: Secondary | ICD-10-CM | POA: Diagnosis not present

## 2024-01-20 DIAGNOSIS — Z7951 Long term (current) use of inhaled steroids: Secondary | ICD-10-CM | POA: Diagnosis not present

## 2024-01-20 DIAGNOSIS — I4891 Unspecified atrial fibrillation: Secondary | ICD-10-CM | POA: Diagnosis not present

## 2024-01-20 DIAGNOSIS — Z853 Personal history of malignant neoplasm of breast: Secondary | ICD-10-CM | POA: Diagnosis not present

## 2024-01-20 DIAGNOSIS — M8580 Other specified disorders of bone density and structure, unspecified site: Secondary | ICD-10-CM | POA: Diagnosis not present

## 2024-01-20 DIAGNOSIS — N1832 Chronic kidney disease, stage 3b: Secondary | ICD-10-CM | POA: Diagnosis not present

## 2024-01-20 DIAGNOSIS — I10 Essential (primary) hypertension: Secondary | ICD-10-CM | POA: Diagnosis not present

## 2024-01-20 DIAGNOSIS — Z9981 Dependence on supplemental oxygen: Secondary | ICD-10-CM | POA: Diagnosis not present

## 2024-01-20 DIAGNOSIS — M1711 Unilateral primary osteoarthritis, right knee: Secondary | ICD-10-CM | POA: Diagnosis not present

## 2024-01-20 DIAGNOSIS — Z96652 Presence of left artificial knee joint: Secondary | ICD-10-CM | POA: Diagnosis not present

## 2024-01-20 DIAGNOSIS — J4489 Other specified chronic obstructive pulmonary disease: Secondary | ICD-10-CM | POA: Diagnosis not present

## 2024-01-20 DIAGNOSIS — G250 Essential tremor: Secondary | ICD-10-CM | POA: Diagnosis not present

## 2024-01-21 DIAGNOSIS — J4489 Other specified chronic obstructive pulmonary disease: Secondary | ICD-10-CM | POA: Diagnosis not present

## 2024-01-21 DIAGNOSIS — J9611 Chronic respiratory failure with hypoxia: Secondary | ICD-10-CM | POA: Diagnosis not present

## 2024-01-21 DIAGNOSIS — R918 Other nonspecific abnormal finding of lung field: Secondary | ICD-10-CM | POA: Diagnosis not present

## 2024-01-21 DIAGNOSIS — D869 Sarcoidosis, unspecified: Secondary | ICD-10-CM | POA: Diagnosis not present

## 2024-01-28 DIAGNOSIS — Z9981 Dependence on supplemental oxygen: Secondary | ICD-10-CM | POA: Diagnosis not present

## 2024-01-28 DIAGNOSIS — E782 Mixed hyperlipidemia: Secondary | ICD-10-CM | POA: Diagnosis not present

## 2024-01-28 DIAGNOSIS — G20A1 Parkinson's disease without dyskinesia, without mention of fluctuations: Secondary | ICD-10-CM | POA: Diagnosis not present

## 2024-01-28 DIAGNOSIS — I10 Essential (primary) hypertension: Secondary | ICD-10-CM | POA: Diagnosis not present

## 2024-01-28 DIAGNOSIS — N1832 Chronic kidney disease, stage 3b: Secondary | ICD-10-CM | POA: Diagnosis not present

## 2024-01-28 DIAGNOSIS — Z556 Problems related to health literacy: Secondary | ICD-10-CM | POA: Diagnosis not present

## 2024-01-28 DIAGNOSIS — M1711 Unilateral primary osteoarthritis, right knee: Secondary | ICD-10-CM | POA: Diagnosis not present

## 2024-01-28 DIAGNOSIS — Z7984 Long term (current) use of oral hypoglycemic drugs: Secondary | ICD-10-CM | POA: Diagnosis not present

## 2024-01-28 DIAGNOSIS — J4489 Other specified chronic obstructive pulmonary disease: Secondary | ICD-10-CM | POA: Diagnosis not present

## 2024-01-28 DIAGNOSIS — G250 Essential tremor: Secondary | ICD-10-CM | POA: Diagnosis not present

## 2024-01-28 DIAGNOSIS — M8580 Other specified disorders of bone density and structure, unspecified site: Secondary | ICD-10-CM | POA: Diagnosis not present

## 2024-01-28 DIAGNOSIS — Z79899 Other long term (current) drug therapy: Secondary | ICD-10-CM | POA: Diagnosis not present

## 2024-01-28 DIAGNOSIS — H9190 Unspecified hearing loss, unspecified ear: Secondary | ICD-10-CM | POA: Diagnosis not present

## 2024-01-28 DIAGNOSIS — Z8673 Personal history of transient ischemic attack (TIA), and cerebral infarction without residual deficits: Secondary | ICD-10-CM | POA: Diagnosis not present

## 2024-01-28 DIAGNOSIS — Z96652 Presence of left artificial knee joint: Secondary | ICD-10-CM | POA: Diagnosis not present

## 2024-01-28 DIAGNOSIS — Z7951 Long term (current) use of inhaled steroids: Secondary | ICD-10-CM | POA: Diagnosis not present

## 2024-01-28 DIAGNOSIS — Z9012 Acquired absence of left breast and nipple: Secondary | ICD-10-CM | POA: Diagnosis not present

## 2024-01-28 DIAGNOSIS — I4891 Unspecified atrial fibrillation: Secondary | ICD-10-CM | POA: Diagnosis not present

## 2024-01-28 DIAGNOSIS — M51369 Other intervertebral disc degeneration, lumbar region without mention of lumbar back pain or lower extremity pain: Secondary | ICD-10-CM | POA: Diagnosis not present

## 2024-01-28 DIAGNOSIS — Z853 Personal history of malignant neoplasm of breast: Secondary | ICD-10-CM | POA: Diagnosis not present

## 2024-01-28 DIAGNOSIS — E1122 Type 2 diabetes mellitus with diabetic chronic kidney disease: Secondary | ICD-10-CM | POA: Diagnosis not present

## 2024-02-01 DIAGNOSIS — M51369 Other intervertebral disc degeneration, lumbar region without mention of lumbar back pain or lower extremity pain: Secondary | ICD-10-CM | POA: Diagnosis not present

## 2024-02-01 DIAGNOSIS — Z556 Problems related to health literacy: Secondary | ICD-10-CM | POA: Diagnosis not present

## 2024-02-01 DIAGNOSIS — Z7984 Long term (current) use of oral hypoglycemic drugs: Secondary | ICD-10-CM | POA: Diagnosis not present

## 2024-02-01 DIAGNOSIS — Z9981 Dependence on supplemental oxygen: Secondary | ICD-10-CM | POA: Diagnosis not present

## 2024-02-01 DIAGNOSIS — M1711 Unilateral primary osteoarthritis, right knee: Secondary | ICD-10-CM | POA: Diagnosis not present

## 2024-02-01 DIAGNOSIS — Z79899 Other long term (current) drug therapy: Secondary | ICD-10-CM | POA: Diagnosis not present

## 2024-02-01 DIAGNOSIS — Z7951 Long term (current) use of inhaled steroids: Secondary | ICD-10-CM | POA: Diagnosis not present

## 2024-02-01 DIAGNOSIS — Z9012 Acquired absence of left breast and nipple: Secondary | ICD-10-CM | POA: Diagnosis not present

## 2024-02-01 DIAGNOSIS — H9190 Unspecified hearing loss, unspecified ear: Secondary | ICD-10-CM | POA: Diagnosis not present

## 2024-02-01 DIAGNOSIS — G250 Essential tremor: Secondary | ICD-10-CM | POA: Diagnosis not present

## 2024-02-01 DIAGNOSIS — Z96652 Presence of left artificial knee joint: Secondary | ICD-10-CM | POA: Diagnosis not present

## 2024-02-01 DIAGNOSIS — E1122 Type 2 diabetes mellitus with diabetic chronic kidney disease: Secondary | ICD-10-CM | POA: Diagnosis not present

## 2024-02-01 DIAGNOSIS — I4891 Unspecified atrial fibrillation: Secondary | ICD-10-CM | POA: Diagnosis not present

## 2024-02-01 DIAGNOSIS — J4489 Other specified chronic obstructive pulmonary disease: Secondary | ICD-10-CM | POA: Diagnosis not present

## 2024-02-01 DIAGNOSIS — Z853 Personal history of malignant neoplasm of breast: Secondary | ICD-10-CM | POA: Diagnosis not present

## 2024-02-01 DIAGNOSIS — Z8673 Personal history of transient ischemic attack (TIA), and cerebral infarction without residual deficits: Secondary | ICD-10-CM | POA: Diagnosis not present

## 2024-02-01 DIAGNOSIS — N1832 Chronic kidney disease, stage 3b: Secondary | ICD-10-CM | POA: Diagnosis not present

## 2024-02-01 DIAGNOSIS — I10 Essential (primary) hypertension: Secondary | ICD-10-CM | POA: Diagnosis not present

## 2024-02-01 DIAGNOSIS — G20A1 Parkinson's disease without dyskinesia, without mention of fluctuations: Secondary | ICD-10-CM | POA: Diagnosis not present

## 2024-02-01 DIAGNOSIS — M8580 Other specified disorders of bone density and structure, unspecified site: Secondary | ICD-10-CM | POA: Diagnosis not present

## 2024-02-01 DIAGNOSIS — E782 Mixed hyperlipidemia: Secondary | ICD-10-CM | POA: Diagnosis not present
